# Patient Record
Sex: Male | Born: 1955 | Race: White | Hispanic: No | Marital: Married | Smoking: Current some day smoker
Health system: Southern US, Community
[De-identification: ages and names within clinical notes are randomized; demographics above are authoritative.]

## PROBLEM LIST (undated history)

## (undated) DIAGNOSIS — Z87438 Personal history of other diseases of male genital organs: Secondary | ICD-10-CM

## (undated) DIAGNOSIS — H9319 Tinnitus, unspecified ear: Secondary | ICD-10-CM

## (undated) DIAGNOSIS — K5730 Diverticulosis of large intestine without perforation or abscess without bleeding: Secondary | ICD-10-CM

## (undated) DIAGNOSIS — R911 Solitary pulmonary nodule: Secondary | ICD-10-CM

## (undated) DIAGNOSIS — J449 Chronic obstructive pulmonary disease, unspecified: Secondary | ICD-10-CM

## (undated) DIAGNOSIS — J189 Pneumonia, unspecified organism: Secondary | ICD-10-CM

## (undated) DIAGNOSIS — F419 Anxiety disorder, unspecified: Secondary | ICD-10-CM

## (undated) DIAGNOSIS — C679 Malignant neoplasm of bladder, unspecified: Secondary | ICD-10-CM

## (undated) DIAGNOSIS — Z8669 Personal history of other diseases of the nervous system and sense organs: Secondary | ICD-10-CM

## (undated) DIAGNOSIS — M1990 Unspecified osteoarthritis, unspecified site: Secondary | ICD-10-CM

## (undated) DIAGNOSIS — M5030 Other cervical disc degeneration, unspecified cervical region: Secondary | ICD-10-CM

## (undated) DIAGNOSIS — D494 Neoplasm of unspecified behavior of bladder: Secondary | ICD-10-CM

## (undated) DIAGNOSIS — R221 Localized swelling, mass and lump, neck: Secondary | ICD-10-CM

## (undated) DIAGNOSIS — I10 Essential (primary) hypertension: Secondary | ICD-10-CM

## (undated) DIAGNOSIS — Z8781 Personal history of (healed) traumatic fracture: Secondary | ICD-10-CM

## (undated) DIAGNOSIS — Z87828 Personal history of other (healed) physical injury and trauma: Secondary | ICD-10-CM

## (undated) HISTORY — PX: HAND SURGERY: SHX662

## (undated) HISTORY — PX: FRACTURE SURGERY: SHX138

## (undated) HISTORY — PX: COLONOSCOPY: SHX174

---

## 1979-06-29 DIAGNOSIS — Z8781 Personal history of (healed) traumatic fracture: Secondary | ICD-10-CM

## 1979-06-29 DIAGNOSIS — Z87828 Personal history of other (healed) physical injury and trauma: Secondary | ICD-10-CM

## 1979-06-29 HISTORY — DX: Personal history of other (healed) physical injury and trauma: Z87.828

## 1979-06-29 HISTORY — DX: Personal history of (healed) traumatic fracture: Z87.81

## 1999-11-13 ENCOUNTER — Emergency Department (HOSPITAL_COMMUNITY): Admission: EM | Admit: 1999-11-13 | Discharge: 1999-11-13 | Payer: Self-pay | Admitting: Emergency Medicine

## 2004-08-25 ENCOUNTER — Ambulatory Visit: Payer: Self-pay | Admitting: Internal Medicine

## 2005-06-02 ENCOUNTER — Ambulatory Visit: Payer: Self-pay | Admitting: Internal Medicine

## 2005-10-28 ENCOUNTER — Emergency Department (HOSPITAL_COMMUNITY): Admission: EM | Admit: 2005-10-28 | Discharge: 2005-10-28 | Payer: Self-pay | Admitting: Emergency Medicine

## 2011-08-20 ENCOUNTER — Emergency Department (HOSPITAL_COMMUNITY): Payer: Worker's Compensation

## 2011-08-20 ENCOUNTER — Emergency Department (HOSPITAL_COMMUNITY)
Admission: EM | Admit: 2011-08-20 | Discharge: 2011-08-20 | Disposition: A | Discharge status: Home / Self Care | Payer: Worker's Compensation | Attending: Emergency Medicine | Admitting: Emergency Medicine

## 2011-08-20 ENCOUNTER — Encounter (HOSPITAL_COMMUNITY): Payer: Self-pay | Admitting: *Deleted

## 2011-08-20 DIAGNOSIS — IMO0002 Reserved for concepts with insufficient information to code with codable children: Secondary | ICD-10-CM | POA: Insufficient documentation

## 2011-08-20 DIAGNOSIS — M79609 Pain in unspecified limb: Secondary | ICD-10-CM | POA: Insufficient documentation

## 2011-08-20 DIAGNOSIS — S9030XA Contusion of unspecified foot, initial encounter: Secondary | ICD-10-CM

## 2011-08-20 DIAGNOSIS — Y9269 Other specified industrial and construction area as the place of occurrence of the external cause: Secondary | ICD-10-CM | POA: Insufficient documentation

## 2011-08-20 MED ORDER — HYDROCODONE-ACETAMINOPHEN 5-325 MG PO TABS: 1.0000 | ORAL_TABLET | ORAL | Status: AC | PRN

## 2011-08-20 MED ORDER — OXYCODONE-ACETAMINOPHEN 5-325 MG PO TABS
1.0000 | ORAL_TABLET | Freq: Once | ORAL | Status: AC
  Administered 2011-08-20: 1 via ORAL
  Filled 2011-08-20: qty 1

## 2011-08-20 NOTE — ED Provider Notes (Signed)
History     CSN: [id number]  Arrival date & time 08/20/11  1810   First MD Initiated Contact with Patient 08/20/11 1826      Chief Complaint  Patient presents with  . Foot Injury    (Consider location/radiation/quality/duration/timing/severity/associated sxs/prior treatment) HPI  The patient presents to the emergency department with complaints of injuring his foot while working. Patient was moving a box and did not see a heavy metal bar behind a box when it rolled down and landed on his right foot hurting his great toe and the top part of his foot. The patient is able to walk on with pain. The patient admits that the toe is swollen, painful. The patient denies injury to any other part of his body. This is a workers comp issue as the patient is here with a representative from work.  History reviewed. No pertinent past medical history.  History reviewed. No pertinent past surgical history.  History reviewed. No pertinent family history.  History  Substance Use Topics  . Smoking status: Not on file  . Smokeless tobacco: Not on file  . Alcohol Use: Not on file      Review of Systems  All other systems reviewed and are negative.    Allergies  Review of patient's allergies indicates no known allergies.  Home Medications   Current Outpatient Rx  Name Route Sig Dispense Refill  . HYDROCODONE-ACETAMINOPHEN 5-325 MG PO TABS Oral Take 1 tablet by mouth every 4 (four) hours as needed for pain. 6 tablet 0    BP 138/83  Pulse 76  Temp(Src) 98 F (36.7 C) (Oral)  Resp 20  SpO2 100%  Physical Exam  Nursing note and vitals reviewed. Constitutional: He appears well-developed and well-nourished. No distress.  HENT:  Head: Normocephalic and atraumatic.  Eyes: Pupils are equal, round, and reactive to light.  Neck: Normal range of motion. Neck supple.  Cardiovascular: Normal rate and regular rhythm.   Pulmonary/Chest: Effort normal.  Abdominal: Soft.  Musculoskeletal:     Right ankle: He exhibits decreased range of motion (ot right great toe due to pain) and swelling. He exhibits no ecchymosis, no deformity, no laceration and normal pulse. tenderness (to anteerior/lateral portion of foot). Achilles tendon normal.       Feet:  Neurological: He is alert.  Skin: Skin is warm and dry.    ED Course  Procedures (including critical care time)  Labs Reviewed - No data to display Dg Foot Complete Right  08/20/2011  *RADIOLOGY REPORT*  Clinical Data: Right foot injury with pain.  RIGHT FOOT COMPLETE - 3+ VIEW  Comparison: None.  Findings: Linear lucency in the calcaneus is probably from osseous ridging given that the patient's pain is along the great toe and metatarsals.  No fracture is observed.  An os peroneus is present.  No malalignment at the Lisfranc joint.  IMPRESSION:  1.  No significant abnormality identified.  Original Report Authenticated By: Dellia Cloud, M.D.     1. Foot contusion       MDM  Pt xray negative for fracture. Pt given ice pack,  post op boot, Lortab (6 pills), and referral to Ortho.        Dorthula Matas, PA 08/20/11 1901

## 2011-08-20 NOTE — Discharge Instructions (Signed)
Bone Bruise  A bone bruise is a small hidden fracture of the bone. It typically occurs with bones located close to the surface of the skin.  SYMPTOMS  The pain lasts longer than a normal bruise.   The bruised area is difficult to use.   There may be discoloration or swelling of the bruised area.   When a bone bruise is found with injury to the anterior cruciate ligament (in the knee) there is often an increased:   Amount of fluid in the knee   Time the fluid in the knee lasts.   Number of days until you are walking normally and regaining the motion you had before the injury.   Number of days with pain from the injury.  DIAGNOSIS  It can only be seen on X-rays known as MRIs. This stands for magnetic resonance imaging. A regular X-ray taken of a bone bruise would appear to be normal. A bone bruise is a common injury in the knee and the heel bone (calcaneus). The problems are similar to those produced by stress fractures, which are bone injuries caused by overuse. A bone bruise may also be a sign of other injuries. For example, bone bruises are commonly found where an anterior cruciate ligament (ACL) in the knee has been pulled away from the bone (ruptured). A ligament is a tough fibrous material that connects bones together to make our joints stable. Bruises of the bone last a lot longer than bruises of the muscle or tissues beneath the skin. Bone bruises can last from days to months and are often more severe and painful than other bruises. TREATMENT Because bone bruises are sudden injuries you cannot often prevent them, other than by being extremely careful. Some things you can do to improve the condition are:  Apply ice to the sore area for 15 to 20 minutes, 3 to 4 times per day while awake for the first 2 days. Put the ice in a plastic bag, and place a towel between the bag of ice and your skin.   Keep your bruised area raised (elevated) when possible to lessen swelling.   For activity:     Use crutches when necessary; do not put weight on the injured leg until you are no longer tender.   You may walk on your affected part as the pain allows, or as instructed.   Start weight bearing gradually on the bruised part.   Continue to use crutches or a cane until you can stand without causing pain, or as instructed.   If a plaster splint was applied, wear the splint until you are seen for a follow-up examination. Rest it on nothing harder than a pillow the first 24 hours. Do not put weight on it. Do not get it wet. You may take it off to take a shower or bath.   If an air splint was applied, more air may be blown into or out of the splint as needed for comfort. You may take it off at night and to take a shower or bath.   Wiggle your toes in the splint several times per day if you are able.   You may have been given an elastic bandage to use with the plaster splint or alone. The splint is too tight if you have numbness, tingling or if your foot becomes cold and blue. Adjust the bandage to make it comfortable.   Only take over-the-counter or prescription medicines for pain, discomfort, or fever as directed by   your caregiver.   Follow all instructions for follow up with your caregiver. This includes any orthopedic referrals, physical therapy, and rehabilitation. Any delay in obtaining necessary care could result in a delay or failure of the bones to heal.  SEEK MEDICAL CARE IF:   You have an increase in bruising, swelling, or pain.   You notice coldness of your toes.   You do not get pain relief with medications.  SEEK IMMEDIATE MEDICAL CARE IF:   Your toes are numb or blue.   You have severe pain not controlled with medications.   If any of the problems that caused you to seek care are becoming worse.  Document Released: 09/04/2003 Document Revised: 02/24/2011 Document Reviewed: 01/17/2008 [facility] Patient Information 2012 [place], [place].Contusion (Bruise) of Foot Injury  to the foot causes bruises (contusions). Contusions are caused by bleeding from small blood vessels that allow blood to leak out into the muscles, cord-like structures that attach muscle to bone (tendons), and/or other soft tissue.  CAUSES  Contusions of the foot are common. Bruises are frequently seen from:  Contact sports injuries.   The use of medications that thin the blood (anti-coagulants).   Aspirin and non-steroidal anti-inflammatory agents that decrease the clotting ability.   People with vitamin deficiencies.  SYMPTOMS  Signs of foot injury include pain and swelling. At first there may be discoloration from blood under the skin. This will appear blue to purple in color. As the bruise ages, the color turns yellow. Swelling may limit the movement of the toes.  Complications from foot injury may include:  Collections of blood leading to disability if calcium deposits form. These can later limit movement in the foot.   Infection of the foot if there are breaks in the skin.   Rupture of the tendons that may need surgical repair.  DIAGNOSIS  Diagnosing foot injuries can be made by observation. If problems continue, X-rays may be needed to make sure there are no broken bones (fractures). Continuing problems may require physical therapy.  HOME CARE INSTRUCTIONS   Apply ice to the injury for 15 to 20 minutes, 3 to 4 times per day. Put the ice in a plastic bag and place a towel between the bag of ice and your skin.   An elastic wrap (like an Ace bandage) may be used to keep swelling down.   Keep foot elevated to reduce swelling and discomfort.   Try to avoid standing or walking while the foot is painful. Do not resume use until instructed by your caregiver. Then begin use gradually. If pain develops, decrease use and continue the above measures. Gradually increase activities that do not cause discomfort until you slowly have normal use.   Only take over-the-counter or prescription  medicines for pain, discomfort, or fever as directed by your caregiver. Use only if your caregiver has not given medications that would interfere.   Begin daily rehabilitation exercises when supportive wrapping is no longer needed.   Use ice massage for 10 minutes before and after workouts. Fill a large styrofoam cup with water and freeze. Tear a small amount of foam from the top so ice protrudes. Massage ice firmly over the injured area in a circle about the size of a softball.   Always eat a well balanced diet.   Follow all instructions for follow up with your caregiver, any orthopedic referrals, physical therapy and rehabilitation. Any delay in obtaining necessary care could result in delayed healing, and temporary or permanent disability.  SEEK IMMEDIATE MEDICAL CARE IF:   Your pain and swelling increase, or pain is uncontrolled with medications.   You have loss of feeling in your foot, or your foot turns cold or blue.   An oral temperature above 102 F (38.9 C) develops, not controlled by medication.   Your foot becomes warm to touch, or you have more pain with movement of your toes.   You have a foot contusion that does not improve in 1 or 2 days.   Skin is broken and signs of infection occur (drainage, increasing pain, fever, headache, muscle aches, dizziness or a general ill feeling).   You develop new, unexplained symptoms, or an increase of the symptoms that brought you to your caregiver.  MAKE SURE YOU:   Understand these instructions.   Will watch your condition.   Will get help right away if you are not doing well or get worse.  Document Released: 04/05/2006 Document Revised: 02/24/2011 Document Reviewed: 05/30/2007 [facility] Patient Information 2012 [place], [place].

## 2011-08-20 NOTE — ED Notes (Signed)
Pt in c/o right foot injury at work, states a bar fell on his foot, swelling noted

## 2011-08-20 NOTE — ED Notes (Signed)
Patient transported to X-ray 

## 2011-08-21 NOTE — ED Provider Notes (Signed)
Medical screening examination/treatment/procedure(s) were performed by non-physician practitioner and as supervising physician I was immediately available for consultation/collaboration.  Ghazal Pevey T Marthann Abshier, MD 08/21/11 2027 

## 2014-09-30 ENCOUNTER — Encounter: Payer: Self-pay | Admitting: Family Medicine

## 2014-09-30 ENCOUNTER — Ambulatory Visit (INDEPENDENT_AMBULATORY_CARE_PROVIDER_SITE_OTHER): Payer: BLUE CROSS/BLUE SHIELD | Admitting: Family Medicine

## 2014-09-30 VITALS — BP 130/82 | HR 78 | Temp 98.6°F | Resp 16 | Ht 67.75 in | Wt 199.2 lb

## 2014-09-30 DIAGNOSIS — N410 Acute prostatitis: Secondary | ICD-10-CM

## 2014-09-30 DIAGNOSIS — R310 Gross hematuria: Secondary | ICD-10-CM

## 2014-09-30 LAB — POCT UA - MICROSCOPIC ONLY
CASTS, UR, LPF, POC: NEGATIVE
Crystals, Ur, HPF, POC: NEGATIVE
Epithelial cells, urine per micros: NEGATIVE
MUCUS UA: NEGATIVE
WBC, UR, HPF, POC: NEGATIVE

## 2014-09-30 LAB — COMPREHENSIVE METABOLIC PANEL
ALBUMIN: 4.4 g/dL (ref 3.5–5.2)
ALK PHOS: 64 U/L (ref 39–117)
ALT: 25 U/L (ref 0–53)
AST: 21 U/L (ref 0–37)
BUN: 16 mg/dL (ref 6–23)
CHLORIDE: 105 meq/L (ref 96–112)
CO2: 24 meq/L (ref 19–32)
Calcium: 9.5 mg/dL (ref 8.4–10.5)
Creat: 0.85 mg/dL (ref 0.50–1.35)
GLUCOSE: 84 mg/dL (ref 70–99)
Potassium: 4.3 mEq/L (ref 3.5–5.3)
Sodium: 138 mEq/L (ref 135–145)
Total Bilirubin: 0.4 mg/dL (ref 0.2–1.2)
Total Protein: 7.6 g/dL (ref 6.0–8.3)

## 2014-09-30 LAB — CBC
HCT: 47.1 % (ref 39.0–52.0)
Hemoglobin: 16.2 g/dL (ref 13.0–17.0)
MCH: 30.4 pg (ref 26.0–34.0)
MCHC: 34.4 g/dL (ref 30.0–36.0)
MCV: 88.4 fL (ref 78.0–100.0)
MPV: 10.1 fL (ref 8.6–12.4)
PLATELETS: 228 10*3/uL (ref 150–400)
RBC: 5.33 MIL/uL (ref 4.22–5.81)
RDW: 13.7 % (ref 11.5–15.5)
WBC: 10.4 10*3/uL (ref 4.0–10.5)

## 2014-09-30 LAB — POCT URINALYSIS DIPSTICK
GLUCOSE UA: NEGATIVE
NITRITE UA: NEGATIVE
PH UA: 5
Protein, UA: >=300
Spec Grav, UA: 1.025
UROBILINOGEN UA: 1

## 2014-09-30 MED ORDER — CIPROFLOXACIN HCL 500 MG PO TABS: 500.0000 mg | ORAL_TABLET | Freq: Two times a day (BID) | ORAL | Status: DC

## 2014-09-30 NOTE — Progress Notes (Signed)
   Subjective:    Patient ID: Ronald Atkins, male    DOB: [birth year], 59 y.o.   MRN: [id number]  HPI This is a pleasant 59 yo male who presents today with hematuria. He has not seen his PCP for several years. 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  He had an episode of rust colored urine last night and then had several episodes of frank hematuria. He has felt run down over the last week.   He had hematuria about 6 years ago and was diagnosed with prostatitis. He was treated with antibiotics. He takes vitamins and his urine is usually bright yellow.  He stopped smoking cigarettes 10 months ago (smoked 2 ppd x 15 years, 1 ppd x 1) and uses vaporized nicotine BID.   History reviewed. No pertinent past medical history. Past Surgical History  Procedure Laterality Date  . Fracture surgery     Family History  Problem Relation Age of Onset  . Heart disease Mother    History  Substance Use Topics  . Smoking status: Former Research scientist (life sciences)  . Smokeless tobacco: Not on file  . Alcohol Use: 0.6 oz/week    1 Standard drinks or equivalent per week   Review of Systems No fever, mild nausea, no dysuria, + frequency, no flank pain, some lower abdominal pressure.    Objective:   Physical Exam  Constitutional: He is oriented to person, place, and time. He appears well-developed and well-nourished.  HENT:  Head: Normocephalic and atraumatic.  Eyes: Conjunctivae are normal.  Neck: Normal range of motion. Neck supple.  Cardiovascular: Normal rate and regular rhythm.   Pulmonary/Chest: Effort normal and breath sounds normal.  Abdominal: Soft. Bowel sounds are normal. He exhibits no distension and no mass. There is tenderness in the suprapubic area. There is no rebound, no guarding and no CVA tenderness.  Genitourinary: Prostate is enlarged (mildly) and tender (mildly).  Musculoskeletal: Normal range of motion.  Neurological: He is alert and oriented to person, place, and time.  Skin: Skin is warm  and dry.  Psychiatric: He has a normal mood and affect. His behavior is normal. Judgment and thought content normal.  Vitals reviewed.  BP 130/82 mmHg  Pulse 78  Temp(Src) 98.6 F (37 C) (Oral)  Resp 16  Ht 5' 7.75" (1.721 m)  Wt 199 lb 3.2 oz (90.357 kg)  BMI 30.51 kg/m2  SpO2 98%     Assessment & Plan:  Discussed with Dr. Tamala Julian 1. Gross hematuria - POCT urinalysis dipstick - POCT UA - Microscopic Only - CBC - Comprehensive metabolic panel - Ambulatory referral to Urology - Urine culture  2. Acute prostatitis - ciprofloxacin (CIPRO) 500 MG tablet; Take 1 tablet (500 mg total) by mouth 2 (two) times daily.  Dispense: 14 tablet; Refill: 0  - RTC if fever greater than 101, vomiting, excessive bleeding   Elby Beck, FNP-BC  [facility]  10/04/2014 1:53 PM

## 2014-09-30 NOTE — Patient Instructions (Addendum)
Drink at least 8 glasses of liquids daily Take antibiotic twice a day for 14 days We will call you with an appointment for urology

## 2014-10-01 LAB — URINE CULTURE
Colony Count: NO GROWTH
Organism ID, Bacteria: NO GROWTH

## 2014-11-27 DIAGNOSIS — R911 Solitary pulmonary nodule: Secondary | ICD-10-CM

## 2014-11-27 DIAGNOSIS — K5730 Diverticulosis of large intestine without perforation or abscess without bleeding: Secondary | ICD-10-CM

## 2014-11-27 HISTORY — DX: Solitary pulmonary nodule: R91.1

## 2014-11-27 HISTORY — DX: Diverticulosis of large intestine without perforation or abscess without bleeding: K57.30

## 2015-01-13 ENCOUNTER — Other Ambulatory Visit: Payer: Self-pay | Admitting: Urology

## 2015-02-04 ENCOUNTER — Encounter (HOSPITAL_BASED_OUTPATIENT_CLINIC_OR_DEPARTMENT_OTHER): Payer: Self-pay | Admitting: *Deleted

## 2015-02-04 NOTE — Progress Notes (Signed)
SPOKE W/ WIFE.  NPO AFTER MN.  ARRIVE AT 0600.  NEEDS HG.

## 2015-02-10 NOTE — H&P (Signed)
History of Present Illness     Follow-up- PCP NP Remus Loffler hematuria-May 2016-last month patient developed rust-colored urine which then turned to frank hematuria. He passed a "ball of tissue" which might have been a small clot. He had some flank pain and also some malaise. All of his symptoms improved. His UA showed too numerous to count red cells, BUN 16, creatinine 0.85. He was started on Cipro for possible prostatitis. He has no history of kidney stones or urologic surgery. He has no lower urinary tract symptoms.    He is a former smoker and quit about 10 months ago. He has a 15-pack-year history. He is a Musician.     Urologic history includes treatment for hematuria and prostatitis about 6 years ago.     July 2016 interval history  Patient returns and continue management of gross hematuria. He underwent CT hematuria protocol and no vomiting which revealed a right posterior bladder tumor. Tumor measured about 2 cm. There was no hydronephrosis.    He's been well without dysuria or gross hematuria.   Surgical History Problems  1. History of No Surgical Problems  Current Meds 1. Ibuprofen TABS;  Therapy: (Recorded:23May2016) to Recorded 2. Multi-Vitamin TABS;  Therapy: (Recorded:23May2016) to Recorded  Allergies Medication  1. No Known Drug Allergies  Family History Problems  1. Family history of heart murmur (Z84.89) : Mother 2. Family history of hepatic cirrhosis (Z83.79) : Father 3. Family history of malignant neoplasm (Z80.9) : Mother  Social History Problems  1. Daily caffeine consumption, more than 8 servings a day 2. Father deceased 3. Former smoker [phone]) 4. Married 5. Mother deceased 83. Occasional alcohol use 7. Recently stopped smoking 8. Two children 9. Use of non-nicotine containing substance in combustion-free vaporization device (Z78.9)  Vitals Vital Signs [Data Includes: Last 1 Day]  Recorded: [id number] 03:35PM  Blood  Pressure: 137 / 88 Temperature: 97 F Heart Rate: 65  Physical Exam Constitutional: Well nourished and well developed . No acute distress.  Pulmonary: No respiratory distress and normal respiratory rhythm and effort.  Cardiovascular: Heart rate and rhythm are normal . No peripheral edema.  Neuro/Psych:. Mood and affect are appropriate.    Results/Data Urine [Data Includes: Last 1 Day]   [id number]  COLOR YELLOW   APPEARANCE CLEAR   SPECIFIC GRAVITY 1.020   pH 6.0   GLUCOSE NEG mg/dL  BILIRUBIN NEG   KETONE NEG mg/dL  BLOOD LARGE   PROTEIN NEG mg/dL  UROBILINOGEN 0.2 mg/dL  NITRITE NEG   LEUKOCYTE ESTERASE NEG   SQUAMOUS EPITHELIAL/HPF NONE SEEN   WBC NONE SEEN WBC/hpf  RBC 3-6 RBC/hpf  BACTERIA NONE SEEN   CRYSTALS NONE SEEN   CASTS NONE SEEN   Other MUCUS NOTED    The following images/tracing/specimen were independently visualized:  CT - [facility].    Procedure  Procedure: Cystoscopy   Indication: Hematuria.  Informed Consent: Risks, benefits, and potential adverse events were discussed and informed consent was obtained from the patient.  Prep: The patient was prepped with betadine.  Antibiotic prophylaxis: Ciprofloxacin.  Procedure Note:  Urethral meatus:. No abnormalities.  Anterior urethra: No abnormalities.  Prostatic urethra: No abnormalities.  Bladder: Visulization was clear. The ureteral orifices were in the normal anatomic position bilaterally and had clear efflux of urine. A systematic survey of the bladder demonstrated no bladder tumors or stones. The mucosa was smooth without abnormalities. A solitary tumor was visualized in the bladder. A papillary tumor was seen in  the bladder. This tumor was located on the right side, at the neck of the bladder. The patient tolerated the procedure well.  Complications: None.    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 Follow-up Schedule Surgery Office  Follow-up  Status: Hold For -  Appointment   Requested for: [phone] Gross hematuria  2. AU CT-HEMATURIA PROTOCOL; Status:Canceled - Date of Service;  Health Maintenance  3. UA With REFLEX; [Do Not Release]; Status:Resulted - Requires Verification;   Done:  [id number] 03:06PM  Discussion/Summary Right bladder neoplasm-it appears to be emanating from the right bladder neck could be more lateral. I believe I could see the UO clear underneath it. We discussed the nature risks benefits and alternatives to cystoscopy, TURBT with postop mitomycin C. We discussed he might need a ureteral stent. All questions answered.    We discussed the nature bladder neoplasms to recur in progress as well as potential need for restaging procedure.     Signatures Electronically signed by : [name], M.D.; Jan 10 2015  4:37PM EST

## 2015-02-11 ENCOUNTER — Ambulatory Visit (HOSPITAL_BASED_OUTPATIENT_CLINIC_OR_DEPARTMENT_OTHER): Payer: BLUE CROSS/BLUE SHIELD | Admitting: Anesthesiology

## 2015-02-11 ENCOUNTER — Encounter (HOSPITAL_BASED_OUTPATIENT_CLINIC_OR_DEPARTMENT_OTHER): Payer: Self-pay | Admitting: Anesthesiology

## 2015-02-11 ENCOUNTER — Ambulatory Visit (HOSPITAL_BASED_OUTPATIENT_CLINIC_OR_DEPARTMENT_OTHER)
Admission: RE | Admit: 2015-02-11 | Discharge: 2015-02-11 | Disposition: A | Discharge status: Home / Self Care | Payer: BLUE CROSS/BLUE SHIELD | Source: Ambulatory Visit | Attending: Urology | Admitting: Urology

## 2015-02-11 ENCOUNTER — Encounter (HOSPITAL_BASED_OUTPATIENT_CLINIC_OR_DEPARTMENT_OTHER)
Admission: RE | Disposition: A | Discharge status: Home / Self Care | Payer: Self-pay | Source: Ambulatory Visit | Attending: Urology

## 2015-02-11 DIAGNOSIS — N359 Urethral stricture, unspecified: Secondary | ICD-10-CM | POA: Insufficient documentation

## 2015-02-11 DIAGNOSIS — Z87891 Personal history of nicotine dependence: Secondary | ICD-10-CM | POA: Insufficient documentation

## 2015-02-11 DIAGNOSIS — Z789 Other specified health status: Secondary | ICD-10-CM | POA: Diagnosis not present

## 2015-02-11 DIAGNOSIS — D494 Neoplasm of unspecified behavior of bladder: Secondary | ICD-10-CM

## 2015-02-11 DIAGNOSIS — C679 Malignant neoplasm of bladder, unspecified: Secondary | ICD-10-CM | POA: Diagnosis not present

## 2015-02-11 DIAGNOSIS — R310 Gross hematuria: Secondary | ICD-10-CM | POA: Diagnosis not present

## 2015-02-11 HISTORY — DX: Neoplasm of unspecified behavior of bladder: D49.4

## 2015-02-11 HISTORY — PX: TRANSURETHRAL RESECTION OF BLADDER TUMOR: SHX2575

## 2015-02-11 HISTORY — DX: Personal history of other diseases of male genital organs: Z87.438

## 2015-02-11 LAB — POCT HEMOGLOBIN-HEMACUE: Hemoglobin: 15.2 g/dL (ref 13.0–17.0)

## 2015-02-11 SURGERY — TURBT (TRANSURETHRAL RESECTION OF BLADDER TUMOR)
Anesthesia: General | Site: Bladder

## 2015-02-11 MED ORDER — CEFAZOLIN SODIUM 1-5 GM-% IV SOLN
1.0000 g | INTRAVENOUS | Status: DC
  Filled 2015-02-11: qty 50

## 2015-02-11 MED ORDER — LIDOCAINE HCL (CARDIAC) 20 MG/ML IV SOLN
INTRAVENOUS | Status: DC | PRN
  Administered 2015-02-11: 100 mg via INTRAVENOUS

## 2015-02-11 MED ORDER — ACETAMINOPHEN 10 MG/ML IV SOLN
INTRAVENOUS | Status: DC | PRN
  Administered 2015-02-11: 1000 mg via INTRAVENOUS

## 2015-02-11 MED ORDER — NITROFURANTOIN MONOHYD MACRO 100 MG PO CAPS: 100.0000 mg | ORAL_CAPSULE | Freq: Every day | ORAL | Status: DC

## 2015-02-11 MED ORDER — LIDOCAINE HCL 2 % EX GEL
CUTANEOUS | Status: DC | PRN
  Administered 2015-02-11: 1

## 2015-02-11 MED ORDER — MIDAZOLAM HCL 2 MG/2ML IJ SOLN
INTRAMUSCULAR | Status: AC
  Filled 2015-02-11: qty 2

## 2015-02-11 MED ORDER — LACTATED RINGERS IV SOLN
INTRAVENOUS | Status: DC
  Administered 2015-02-11: 07:00:00 via INTRAVENOUS
  Filled 2015-02-11: qty 1000

## 2015-02-11 MED ORDER — OXYCODONE-ACETAMINOPHEN 5-325 MG PO TABS: 1.0000 | ORAL_TABLET | Freq: Four times a day (QID) | ORAL | Status: DC | PRN

## 2015-02-11 MED ORDER — PROPOFOL 10 MG/ML IV BOLUS
INTRAVENOUS | Status: DC | PRN
  Administered 2015-02-11: 250 mg via INTRAVENOUS

## 2015-02-11 MED ORDER — MITOMYCIN CHEMO FOR BLADDER INSTILLATION 40 MG
40.0000 mg | Freq: Once | INTRAVENOUS | Status: DC
  Filled 2015-02-11: qty 40

## 2015-02-11 MED ORDER — FENTANYL CITRATE (PF) 100 MCG/2ML IJ SOLN
INTRAMUSCULAR | Status: DC | PRN
  Administered 2015-02-11 (×2): 50 ug via INTRAVENOUS

## 2015-02-11 MED ORDER — EPHEDRINE SULFATE 50 MG/ML IJ SOLN
INTRAMUSCULAR | Status: DC | PRN
  Administered 2015-02-11: 15 mg via INTRAVENOUS
  Administered 2015-02-11: 10 mg via INTRAVENOUS

## 2015-02-11 MED ORDER — SUCCINYLCHOLINE CHLORIDE 20 MG/ML IJ SOLN
INTRAMUSCULAR | Status: DC | PRN
  Administered 2015-02-11: 50 mg via INTRAVENOUS

## 2015-02-11 MED ORDER — BELLADONNA ALKALOIDS-OPIUM 16.2-60 MG RE SUPP
RECTAL | Status: AC
  Filled 2015-02-11: qty 1

## 2015-02-11 MED ORDER — SODIUM CHLORIDE 0.9 % IR SOLN
Status: DC | PRN
  Administered 2015-02-11: 19000 mL

## 2015-02-11 MED ORDER — DEXAMETHASONE SODIUM PHOSPHATE 4 MG/ML IJ SOLN
INTRAMUSCULAR | Status: DC | PRN
  Administered 2015-02-11: 10 mg via INTRAVENOUS

## 2015-02-11 MED ORDER — CEFAZOLIN SODIUM-DEXTROSE 2-3 GM-% IV SOLR
INTRAVENOUS | Status: AC
  Filled 2015-02-11: qty 50

## 2015-02-11 MED ORDER — CEFAZOLIN SODIUM-DEXTROSE 2-3 GM-% IV SOLR
2.0000 g | INTRAVENOUS | Status: AC
  Administered 2015-02-11: 2 g via INTRAVENOUS
  Filled 2015-02-11: qty 50

## 2015-02-11 MED ORDER — PROMETHAZINE HCL 25 MG/ML IJ SOLN
6.2500 mg | INTRAMUSCULAR | Status: DC | PRN
  Filled 2015-02-11: qty 1

## 2015-02-11 MED ORDER — FENTANYL CITRATE (PF) 100 MCG/2ML IJ SOLN
INTRAMUSCULAR | Status: AC
  Filled 2015-02-11: qty 4

## 2015-02-11 MED ORDER — BELLADONNA ALKALOIDS-OPIUM 16.2-60 MG RE SUPP
RECTAL | Status: DC | PRN
  Administered 2015-02-11: 1 via RECTAL

## 2015-02-11 MED ORDER — ONDANSETRON HCL 4 MG/2ML IJ SOLN
INTRAMUSCULAR | Status: DC | PRN
  Administered 2015-02-11: 4 mg via INTRAVENOUS

## 2015-02-11 MED ORDER — MIDAZOLAM HCL 5 MG/5ML IJ SOLN
INTRAMUSCULAR | Status: DC | PRN
  Administered 2015-02-11: 2 mg via INTRAVENOUS

## 2015-02-11 MED ORDER — FENTANYL CITRATE (PF) 100 MCG/2ML IJ SOLN
25.0000 ug | INTRAMUSCULAR | Status: DC | PRN
  Filled 2015-02-11: qty 1

## 2015-02-11 SURGICAL SUPPLY — 54 items
ADAPTER CATH URET PLST 4-6FR (CATHETERS) IMPLANT
BAG DRAIN URO-CYSTO SKYTR STRL (DRAIN) ×4 IMPLANT
BAG URINE DRAINAGE (UROLOGICAL SUPPLIES) ×4 IMPLANT
BAG URINE LEG 19OZ MD ST LTX (BAG) IMPLANT
BASKET LASER NITINOL 1.9FR (BASKET) IMPLANT
BASKET STNLS GEMINI 4WIRE 3FR (BASKET) IMPLANT
BASKET ZERO TIP NITINOL 2.4FR (BASKET) IMPLANT
CANISTER SUCT LVC 12 LTR MEDI- (MISCELLANEOUS) IMPLANT
CATH FOLEY 2WAY SLVR  5CC 18FR (CATHETERS) ×2
CATH FOLEY 2WAY SLVR  5CC 20FR (CATHETERS)
CATH FOLEY 2WAY SLVR  5CC 22FR (CATHETERS)
CATH FOLEY 2WAY SLVR 5CC 18FR (CATHETERS) ×2 IMPLANT
CATH FOLEY 2WAY SLVR 5CC 20FR (CATHETERS) IMPLANT
CATH FOLEY 2WAY SLVR 5CC 22FR (CATHETERS) IMPLANT
CATH INTERMIT  6FR 70CM (CATHETERS) IMPLANT
CATH URET 5FR 28IN CONE TIP (BALLOONS)
CATH URET 5FR 28IN OPEN ENDED (CATHETERS) IMPLANT
CATH URET 5FR 70CM CONE TIP (BALLOONS) IMPLANT
CATH URET DUAL LUMEN 6-10FR 50 (CATHETERS) IMPLANT
CLOTH BEACON ORANGE TIMEOUT ST (SAFETY) ×4 IMPLANT
DRSG TELFA 3X8 NADH (GAUZE/BANDAGES/DRESSINGS) IMPLANT
ELECT BUTTON BIOP 24F 90D PLAS (MISCELLANEOUS) IMPLANT
ELECT REM PT RETURN 9FT ADLT (ELECTROSURGICAL) ×4
ELECTRODE REM PT RTRN 9FT ADLT (ELECTROSURGICAL) ×2 IMPLANT
EVACUATOR MICROVAS BLADDER (UROLOGICAL SUPPLIES) IMPLANT
GLOVE BIO SURGEON STRL SZ 6.5 (GLOVE) ×3 IMPLANT
GLOVE BIO SURGEON STRL SZ7 (GLOVE) ×8 IMPLANT
GLOVE BIO SURGEON STRL SZ7.5 (GLOVE) ×4 IMPLANT
GLOVE BIO SURGEONS STRL SZ 6.5 (GLOVE) ×1
GLOVE BIOGEL PI IND STRL 6.5 (GLOVE) ×4 IMPLANT
GLOVE BIOGEL PI INDICATOR 6.5 (GLOVE) ×4
GOWN STRL REUS W/ TWL LRG LVL3 (GOWN DISPOSABLE) ×2 IMPLANT
GOWN STRL REUS W/ TWL XL LVL3 (GOWN DISPOSABLE) ×2 IMPLANT
GOWN STRL REUS W/TWL LRG LVL3 (GOWN DISPOSABLE) ×2
GOWN STRL REUS W/TWL XL LVL3 (GOWN DISPOSABLE) ×6 IMPLANT
GUIDEWIRE 0.038 PTFE COATED (WIRE) IMPLANT
GUIDEWIRE ANG ZIPWIRE 038X150 (WIRE) IMPLANT
GUIDEWIRE STR DUAL SENSOR (WIRE) ×4 IMPLANT
HOLDER FOLEY CATH W/STRAP (MISCELLANEOUS) ×4 IMPLANT
IV NS 1000ML (IV SOLUTION) ×2
IV NS 1000ML BAXH (IV SOLUTION) ×2 IMPLANT
IV NS IRRIG 3000ML ARTHROMATIC (IV SOLUTION) ×24 IMPLANT
KIT BALLIN UROMAX 15FX10 (LABEL) IMPLANT
KIT BALLN UROMAX 15FX4 (MISCELLANEOUS) IMPLANT
KIT BALLN UROMAX 26 75X4 (MISCELLANEOUS)
LOOP CUT BIPOLAR 24F LRG (ELECTROSURGICAL) ×4 IMPLANT
MANIFOLD NEPTUNE II (INSTRUMENTS) ×4 IMPLANT
PACK CYSTO (CUSTOM PROCEDURE TRAY) ×4 IMPLANT
PLUG CATH AND CAP STER (CATHETERS) IMPLANT
SET ASPIRATION TUBING (TUBING) IMPLANT
SET HIGH PRES BAL DIL (LABEL)
SHEATH ACCESS URETERAL 38CM (SHEATH) IMPLANT
SYRINGE IRR TOOMEY STRL 70CC (SYRINGE) IMPLANT
WATER STERILE IRR 500ML POUR (IV SOLUTION) ×4 IMPLANT

## 2015-02-11 NOTE — Op Note (Signed)
Preoperative diagnosis: Bladder neoplasm Postoperative diagnosis: Bladder neoplasm, stricture of fossa navicularis, stricture of bulb urethra  Procedure: Exam under anesthesia, TURBT 2-5 cm  Surgeon: Junious Silk  Anesthesia: Denneny  Type of anesthesia: Gen.  Indication for procedure: 59 year old white male with gross hematuria bladder tumor on CT and cystoscopy. He was brought for TURBT and possible mitomycin-C today.  Findings: On exam under anesthesia the penis was circumcised without mass or lesion. The testicles were descended bilaterally and palpably normal. On digital rectal exam the prostate was smooth without hard area or nodule and all landmarks were preserved. I placed a B&O suppository.  On cystoscopy the fossa navicularis was strictured and required dilation to 1 [place], there was a wide caliber stricture in the bulbar urethra, the prostatic urethra appeared normal. In the bladder the trigone and ureteral orifices were in their normal orthotopic position with clear efflux. There was a right bladder tumor a few centimeters above the right ureteral orifice and situated laterally with a small extension anteriorly. The tumor was papillary but did have a more broad-based. It seemed to be superficial. There is an impressive number a feeder vessels going down to the tumor. There were no other tumors in the bladder. There are no stones or foreign bodies.  Description of procedure: After consent was obtained patient brought to the operating room. After adequate anesthesia the patient was placed in lithotomy position and prepped and draped in the usual sterile fashion. A timeout was performed to confirm the patient and procedure. An exam under anesthesia was performed. Tried to pass the 74 French cystoscope per urethra but the fossa navicularis was too tight and was dilated to 24 [place]. The cystoscope was inserted in the bladder carefully inspected with the 30 and 70 lens. The scope was removed  and the resectoscope was attempted to be placed with the visual obturator but would not pass. I had to dilate up to 28 [place] to get the scope to pass. The bulb urethral stricture was just dilated with the scope. Once in the bladder the tumor was resected. The chips were sent as bladder tumor. He had a very mild obturator kick therefore paralysis was given and I resected the base. This was sent as bladder tumor base. Hemostasis was excellent. There was one area that seemed thin but there was obvious continuity of the bladder wall and no perforation. I was concerned if the bladder filled any there was the small potential for extravasation of the mitomycin therefore I decided not to give it. The bladder was filled and the scope removed. A then placed an 67 French Foley left to gravity drainage. The urine was clear. A plan to leave the Foley in for a few days to allow this area to heal.  Complications: None Blood loss: Minimal  Specimens to pathology: #1 bladder tumor #2 bladder tumor base  Drains: 18 French Foley catheter  Disposition: Patient stable to PACU

## 2015-02-11 NOTE — Transfer of Care (Signed)
Immediate Anesthesia Transfer of Care Note  Patient: Ronald Atkins  Procedure(s) Performed: Procedure(s): TRANSURETHRAL RESECTION OF BLADDER TUMOR (TURBT)  (N/A)  Patient Location: PACU  Anesthesia Type:General  Level of Consciousness: awake, alert , oriented and patient cooperative  Airway & Oxygen Therapy: Patient Spontanous Breathing and Patient connected to nasal cannula oxygen  Post-op Assessment: Report given to RN and Post -op Vital signs reviewed and stable  Post vital signs: Reviewed and stable  Last Vitals:  Filed Vitals:   02/11/15 0616  BP: 144/94  Pulse: 63  Temp: 36.8 C  Resp: 16    Complications: No apparent anesthesia complications

## 2015-02-11 NOTE — Discharge Instructions (Signed)
Foley Catheter Care A Foley catheter is a soft, flexible tube that is placed into the bladder to drain urine. A Foley catheter may be inserted if:  You leak urine or are not able to control when you urinate (urinary incontinence).  You are not able to urinate when you need to (urinary retention).  You had prostate surgery or surgery on the genitals.  You have certain medical conditions, such as multiple sclerosis, dementia, or a spinal cord injury. If you are going home with a Foley catheter in place, follow the instructions below. TAKING CARE OF THE CATHETER  Wash your hands with soap and water.  Using mild soap and warm water on a clean washcloth:  Clean the area on your body closest to the catheter insertion site using a circular motion, moving away from the catheter. Never wipe toward the catheter because this could sweep bacteria up into the urethra and cause infection.  Remove all traces of soap. Pat the area dry with a clean towel. For males, reposition the foreskin.  Attach the catheter to your leg so there is no tension on the catheter. Use adhesive tape or a leg strap. If you are using adhesive tape, remove any sticky residue left behind by the previous tape you used.  Keep the drainage bag below the level of the bladder, but keep it off the floor.  Check throughout the day to be sure the catheter is working and urine is draining freely. Make sure the tubing does not become kinked.  Do not pull on the catheter or try to remove it. Pulling could damage internal tissues. TAKING CARE OF THE DRAINAGE BAGS You will be given two drainage bags to take home. One is a large overnight drainage bag, and the other is a smaller leg bag that fits underneath clothing. You may wear the overnight bag at any time, but you should never wear the smaller leg bag at night. Follow the instructions below for how to empty, change, and clean your drainage bags. Emptying the Drainage Bag You must empty  your drainage bag when it is  - full or at least 2-3 times a day.  Wash your hands with soap and water.  Keep the drainage bag below your hips, below the level of your bladder. This stops urine from going back into the tubing and into your bladder.  Hold the dirty bag over the toilet or a clean container.  Open the pour spout at the bottom of the bag and empty the urine into the toilet or container. Do not let the pour spout touch the toilet, container, or any other surface. Doing so can place bacteria on the bag, which can cause an infection.  Clean the pour spout with a gauze pad or cotton ball that has rubbing alcohol on it.  Close the pour spout.  Attach the bag to your leg with adhesive tape or a leg strap.  Wash your hands well. Changing the Drainage Bag Change your drainage bag once a month or sooner if it starts to smell bad or look dirty. Below are steps to follow when changing the drainage bag. 1. Wash your hands with soap and water. 2. Pinch off the rubber catheter so that urine does not spill out. 3. Disconnect the catheter tube from the drainage tube at the connection valve. Do not let the tubes touch any surface. 4. Clean the end of the catheter tube with an alcohol wipe. Use a different alcohol wipe to clean the  end of the drainage tube. 5. Connect the catheter tube to the drainage tube of the clean drainage bag. 6. Attach the new bag to the leg with adhesive tape or a leg strap. Avoid attaching the new bag too tightly. 7. Wash your hands well. Cleaning the Drainage Bag 1. Wash your hands with soap and water. 2. Wash the bag in warm, soapy water. 3. Rinse the bag thoroughly with warm water. 4. Fill the bag with a solution of white vinegar and water (1 cup vinegar to 1 qt warm water [.2 L vinegar to 1 L warm water]). Close the bag and soak it for 30 minutes in the solution. 5. Rinse the bag with warm water. 6. Hang the bag to dry with the pour spout open and hanging  downward. 7. Store the clean bag (once it is dry) in a clean plastic bag. 8. Wash your hands well. PREVENTING INFECTION  Wash your hands before and after handling your catheter.  Take showers daily and wash the area where the catheter enters your body. Do not take baths. Replace wet leg straps with dry ones, if this applies.  Do not use powders, sprays, or lotions on the genital area. Only use creams, lotions, or ointments as directed by your caregiver.  For females, wipe from front to back after each bowel movement.  Drink enough fluids to keep your urine clear or pale yellow unless you have a fluid restriction.  Do not let the drainage bag or tubing touch or lie on the floor.  Wear cotton underwear to absorb moisture and to keep your skin drier. SEEK MEDICAL CARE IF:   Your urine is cloudy or smells unusually bad.  Your catheter becomes clogged.  You are not draining urine into the bag or your bladder feels full.  Your catheter starts to leak. SEEK IMMEDIATE MEDICAL CARE IF:   You have pain, swelling, redness, or pus where the catheter enters the body.  You have pain in the abdomen, legs, lower back, or bladder.  You have a fever.  You see blood fill the catheter, or your urine is pink or red.  You have nausea, vomiting, or chills.  Your catheter gets pulled out. MAKE SURE YOU:   Understand these instructions.  Will watch your condition.  Will get help right away if you are not doing well or get worse.   Cystoscopy, Care After Refer to this sheet in the next few weeks. These instructions provide you with information on caring for yourself after your procedure. Your caregiver may also give you more specific instructions. Your treatment has been planned according to current medical practices, but problems sometimes occur. Call your caregiver if you have any problems or questions after your procedure. HOME CARE INSTRUCTIONS  Things you can do to ease any discomfort  after your procedure include:  Drinking enough water and fluids to keep your urine clear or pale yellow.  Taking a warm bath to relieve any burning feelings. SEEK IMMEDIATE MEDICAL CARE IF:   You have an increase in blood in your urine.  You notice blood clots in your urine.  You have difficulty passing urine.  You have the chills.  You have abdominal pain.  You have a fever or persistent symptoms for more than 2-3 days.  You have a fever and your symptoms suddenly get worse. MAKE SURE YOU:   Understand these instructions.  Will watch your condition.  Will get help right away if you are not doing well  or get worse.     Post Anesthesia Home Care Instructions  Activity: Get plenty of rest for the remainder of the day. A responsible adult should stay with you for 24 hours following the procedure.  For the next 24 hours, DO NOT: -Drive a car -Paediatric nurse -Drink alcoholic beverages -Take any medication unless instructed by your physician -Make any legal decisions or sign important papers.  Meals: Start with liquid foods such as gelatin or soup. Progress to regular foods as tolerated. Avoid greasy, spicy, heavy foods. If nausea and/or vomiting occur, drink only clear liquids until the nausea and/or vomiting subsides. Call your physician if vomiting continues.  Special Instructions/Symptoms: Your throat may feel dry or sore from the anesthesia or the breathing tube placed in your throat during surgery. If this causes discomfort, gargle with warm salt water. The discomfort should disappear within 24 hours.  If you had a scopolamine patch placed behind your ear for the management of post- operative nausea and/or vomiting:  1. The medication in the patch is effective for 72 hours, after which it should be removed.  Wrap patch in a tissue and discard in the trash. Wash hands thoroughly with soap and water. 2. You may remove the patch earlier than 72 hours if you  experience unpleasant side effects which may include dry mouth, dizziness or visual disturbances. 3. Avoid touching the patch. Wash your hands with soap and water after contact with the patch.

## 2015-02-11 NOTE — Anesthesia Postprocedure Evaluation (Signed)
  Anesthesia Post-op Note  Patient: Ronald Atkins  Procedure(s) Performed: Procedure(s) (LRB): TRANSURETHRAL RESECTION OF BLADDER TUMOR (TURBT)  (N/A)  Patient Location: PACU  Anesthesia Type: General  Level of Consciousness: awake and alert   Airway and Oxygen Therapy: Patient Spontanous Breathing  Post-op Pain: mild  Post-op Assessment: Post-op Vital signs reviewed, Patient's Cardiovascular Status Stable, Respiratory Function Stable, Patent Airway and No signs of Nausea or vomiting  Last Vitals:  Filed Vitals:   02/11/15 1023  BP: 137/89  Pulse: 66  Temp: 36.3 C  Resp: 16    Post-op Vital Signs: stable   Complications: No apparent anesthesia complications

## 2015-02-11 NOTE — Interval H&P Note (Signed)
History and Physical Interval Note:  02/11/2015 7:27 AM  Ronald Atkins  has presented today for surgery, with the diagnosis of BLADDER NEOPLASM  The various methods of treatment have been discussed with the patient and family. After consideration of risks, benefits and other options for treatment, the patient has consented to  Procedure(s) with comments: TRANSURETHRAL RESECTION OF BLADDER TUMOR (TURBT) POSSIBLE URETEROSCOPY STENT (N/A) - POSTO MITOMYCIN C URETEROSCOPY (N/A) as a surgical intervention . Discussed specific role, nature, r/b of Ronald Atkins. I discussed with the patient and his wife the nature, potential benefits, risks and alternatives to the above, including side effects of the proposed treatment, the likelihood of the patient achieving the goals of the procedure, and any potential problems that might occur during the procedure or recuperation. All questions answered. Patient elects to proceed.  The patient's history has been reviewed, patient examined, no change in status, stable for surgery.  I have reviewed the patient's chart and labs. Discussed bladder perforation, need for foley/ureteral stents, restaging among others.    Oyindamola Key

## 2015-02-11 NOTE — Anesthesia Procedure Notes (Signed)
Procedure Name: LMA Insertion Date/Time: 02/11/2015 7:37 AM Performed by: Wanita Chamberlain Pre-anesthesia Checklist: Patient identified, Timeout performed, Emergency Drugs available, Suction available and Patient being monitored Patient Re-evaluated:Patient Re-evaluated prior to inductionOxygen Delivery Method: Circle system utilized Preoxygenation: Pre-oxygenation with 100% oxygen Intubation Type: IV induction Ventilation: Mask ventilation without difficulty LMA: LMA inserted LMA Size: 5.0 Number of attempts: 1 Airway Equipment and Method: Bite block Placement Confirmation: breath sounds checked- equal and bilateral and positive ETCO2 Tube secured with: Tape Dental Injury: Teeth and Oropharynx as per pre-operative assessment

## 2015-02-11 NOTE — Anesthesia Preprocedure Evaluation (Addendum)
Anesthesia Evaluation  Patient identified by MRN, date of birth, ID band Patient awake    Reviewed: Allergy & Precautions, NPO status , Patient's Chart, lab work & pertinent test results  Airway Mallampati: II  TM Distance: >3 FB Neck ROM: Full    Dental no notable dental hx. (+) Teeth Intact, Dental Advisory Given,    Pulmonary former smoker,  breath sounds clear to auscultation  Pulmonary exam normal       Cardiovascular Exercise Tolerance: Good negative cardio ROS Normal cardiovascular examRhythm:Regular Rate:Normal     Neuro/Psych negative neurological ROS  negative psych ROS   GI/Hepatic Neg liver ROS, GERD-  Medicated and Controlled,  Endo/Other  negative endocrine ROS  Renal/GU negative Renal ROS  negative genitourinary   Musculoskeletal negative musculoskeletal ROS (+)   Abdominal   Peds negative pediatric ROS (+)  Hematology negative hematology ROS (+)   Anesthesia Other Findings   Reproductive/Obstetrics negative OB ROS                         Anesthesia Physical Anesthesia Plan  ASA: II  Anesthesia Plan: General   Post-op Pain Management:    Induction: Intravenous  Airway Management Planned: LMA  Additional Equipment:   Intra-op Plan:   Post-operative Plan: Extubation in OR  Informed Consent: I have reviewed the patients History and Physical, chart, labs and discussed the procedure including the risks, benefits and alternatives for the proposed anesthesia with the patient or authorized representative who has indicated his/her understanding and acceptance.   Dental advisory given  Plan Discussed with: CRNA and Anesthesiologist  Anesthesia Plan Comments:        Anesthesia Quick Evaluation

## 2015-02-12 ENCOUNTER — Encounter (HOSPITAL_BASED_OUTPATIENT_CLINIC_OR_DEPARTMENT_OTHER): Payer: Self-pay | Admitting: Urology

## 2015-02-27 NOTE — Addendum Note (Signed)
Addendum  created 02/27/15 3754 by Franne Grip, MD   Modules edited: Anesthesia Responsible Staff

## 2015-03-11 ENCOUNTER — Telehealth: Payer: Self-pay | Admitting: Family Medicine

## 2015-03-11 NOTE — Telephone Encounter (Signed)
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

## 2015-08-21 ENCOUNTER — Ambulatory Visit
Admission: RE | Admit: 2015-08-21 | Discharge: 2015-08-21 | Disposition: A | Discharge status: Home / Self Care | Payer: BLUE CROSS/BLUE SHIELD | Source: Ambulatory Visit | Attending: Family Medicine | Admitting: Family Medicine

## 2015-08-21 ENCOUNTER — Other Ambulatory Visit: Payer: Self-pay | Admitting: Family Medicine

## 2015-08-21 DIAGNOSIS — R194 Change in bowel habit: Secondary | ICD-10-CM

## 2015-08-21 DIAGNOSIS — F17200 Nicotine dependence, unspecified, uncomplicated: Secondary | ICD-10-CM

## 2016-02-20 ENCOUNTER — Other Ambulatory Visit: Payer: Self-pay

## 2016-08-05 ENCOUNTER — Other Ambulatory Visit: Payer: Self-pay | Admitting: Urology

## 2016-08-13 ENCOUNTER — Encounter (HOSPITAL_BASED_OUTPATIENT_CLINIC_OR_DEPARTMENT_OTHER): Payer: Self-pay | Admitting: *Deleted

## 2016-08-13 NOTE — Progress Notes (Signed)
To [facility] at 0600- Hg on arrival.

## 2016-08-18 NOTE — H&P (Signed)
Office Visit Report     07/30/2016   --------------------------------------------------------------------------------   Ronald Atkins  MRN: [id number]  PRIMARY CARE:    DOB: Sep 21, 1955, 61 year old Male  REFERRING:    SSN:   PROVIDER:  [name], M.D.    LOCATION:  [facility] [phone]   --------------------------------------------------------------------------------   CC: I have bladder cancer that has been treated.  HPI: Ronald Atkins is a 61 year-old male established patient who is here for follow-up of bladder cancer treatment.  His bladder cancer was superficial and limitied to the bladder lining. His bladder cancer was not muscle invasive.   He did have a TURBT. His last bladder tumor was resected approximately 01/27/2015. He has had the following number of bladder resections: 1.   He has not had blood in his urine recently.   His last cysto was 08/06/2015. His last radiologic test to evaluate the kidneys was approximately 11/27/2014.   Aug 2016 - low-grade TA, right bladder s/p TURBT. No mitomycin-C given due to very thin area. Required dilation of the fossa navicularis. He is still smoking some and drinks a pot of coffee each day. He is a [employer] driver.  -Aug [id number] - bladder bx - posterior erythema - B9.   Last upper tract: Aug 2017 RGP's; June 2016 CT IV - novant  Last cytology: Feb 2017 - negative   He's been well. No dysuria or gross hematuria. He's smoking on and off.     ALLERGIES: OxyCODONE HCl TABS    MEDICATIONS: Men 50 Plus Multivitamin  Vitamin D3     GU PSH: Cystoscopy - 02/05/2016 Cystoscopy TURBT <2 cm - 02/20/2016      PSH Notes: No Surgical Problems   NON-GU PSH: None   GU PMH: Bladder Cancer Lateral - 02/05/2016, Malignant neoplasm of lateral wall of urinary bladder, - 08/06/2015 Bladder, Neoplasm of Unspecified behavior, Bladder neoplasm - 02/14/2015 Gross hematuria, Gross hematuria - 02/14/2015    NON-GU PMH: Encounter for  general adult medical examination without abnormal findings, Encounter for preventive health examination - 01/10/2015    FAMILY HISTORY: heart murmur - Runs In Family hepatic cirrhosis - Runs In Family malignant neoplasm - Runs In Family   SOCIAL HISTORY: Marital Status: Married Current Smoking Status: Patient smokes occasionally.  Types of alcohol consumed: Beer.  Drinks 4+ caffeinated drinks per day.     Notes: Former smoker, Mother deceased, Two children, Use of non-nicotine containing substance in combustion-free vaporization device, Daily caffeine consumption, more than 8 servings a day, Married, Occasional alcohol use, Recently stopped smoking, Father deceased   REVIEW OF SYSTEMS:    GU Review Male:   Patient denies frequent urination, hard to postpone urination, burning/ pain with urination, get up at night to urinate, leakage of urine, stream starts and stops, trouble starting your stream, have to strain to urinate , erection problems, and penile pain.  Gastrointestinal (Upper):   Patient denies nausea, vomiting, and indigestion/ heartburn.  Gastrointestinal (Lower):   Patient denies diarrhea and constipation.  Constitutional:   Patient denies fever, night sweats, weight loss, and fatigue.  Skin:   Patient denies skin rash/ lesion and itching.  Eyes:   Patient denies blurred vision and double vision.  Ears/ Nose/ Throat:   Patient denies sore throat and sinus problems.  Hematologic/Lymphatic:   Patient denies swollen glands and easy bruising.  Cardiovascular:   Patient denies leg swelling and chest pains.  Respiratory:   Patient denies cough and shortness of breath.  Endocrine:   Patient denies excessive thirst.  Musculoskeletal:   Patient denies back pain and joint pain.  Neurological:   Patient denies headaches and dizziness.  Psychologic:   Patient denies depression and anxiety.   VITAL SIGNS:      07/30/2016 01:45 PM  Weight 205 lb / 92.99 kg  Height 69 in / 175.26 cm  BP  125/85 mmHg  Pulse 64 /min  Temperature 98.2 F / 37 C  BMI 30.3 kg/m   GU PHYSICAL EXAMINATION:    Scrotum: No lesions. No edema. No cysts. No warts.  Urethral Meatus: Normal size. No lesion, no wart, no discharge, no polyp. Normal location.  Penis: Circumcised, no warts, no cracks. No dorsal Peyronie's plaques, no left corporal Peyronie's plaques, no right corporal Peyronie's plaques, no scarring, no warts. No balanitis, no meatal stenosis.   MULTI-SYSTEM PHYSICAL EXAMINATION:    Constitutional: Well-nourished. No physical deformities. Normally developed. Good grooming.  Neck: Neck symmetrical, not swollen. Normal tracheal position.  Respiratory: No labored breathing, no use of accessory muscles.   Cardiovascular: Normal temperature, normal extremity pulses, no swelling, no varicosities.  Skin: No paleness, no jaundice, no cyanosis. No lesion, no ulcer, no rash.  Neurologic / Psychiatric: Oriented to time, oriented to place, oriented to person. No depression, no anxiety, no agitation.     PAST DATA REVIEWED:  Source Of History:  Patient   11/19/14  PSA  Total PSA 0.93     PROCEDURES:         Flexible Cystoscopy - 52000  Risks, benefits, and some of the potential complications of the procedure were discussed with the patient. All questions were answered. Informed consent was obtained. Antibiotic prophylaxis was given -- Cipro. Sterile technique and intraurethral analgesia were used.  Meatus:  Normal size. Normal location. Normal condition.  Urethra:  No strictures.  External Sphincter:  Normal.  Verumontanum:  Normal.  Prostate:  Non-obstructing. No hyperplasia.  Bladder Neck:  Non-obstructing.  Ureteral Orifices:  Normal location. Normal size. Normal shape. Effluxed clear urine.  Bladder:  A posterior wall tumor. 1/2 cm tumor. No trabeculation. Normal mucosa. No stones.      The lower urinary tract was carefully examined. The procedure was well-tolerated and without  complications. Antibiotic instructions were given. Instructions were given to call the office immediately for bloody urine, difficulty urinating, painful urination, fever, chills, nausea, vomiting or other illness. The patient stated that he understood these instructions and would comply with them.         Urinalysis w/Scope Dipstick Dipstick Cont'd Micro  Color: Yellow Bilirubin: Neg WBC/hpf: NS (Not Seen)  Appearance: Clear Ketones: Neg RBC/hpf: 3 - 10/hpf  Specific Gravity: 1.025 Blood: 2+ Bacteria: NS (Not Seen)  pH: 5.5 Protein: Trace Cystals: NS (Not Seen)  Glucose: Neg Urobilinogen: 0.2 Casts: NS (Not Seen)    Nitrites: Neg Trichomonas: Not Present    Leukocyte Esterase: Neg Mucous: Not Present      Epithelial Cells: NS (Not Seen)      Yeast: NS (Not Seen)      Sperm: Not Present    ASSESSMENT:      ICD-10 Details  1 GU:   Bladder Cancer Lateral - C67.2    PLAN:           Schedule Return Visit/Planned Activity: Next Available Appointment - Office Visit, Schedule Surgery          Document Letter(s):  Created for Patient: Clinical Summary  Notes:   Bladder cancer-he appears to have an early papillary tumor posterior superiorly. Discussed the nature risks and benefits of bladder biopsy fulguration and epirubicin and he elects to proceed. Discussed again link between smoking and urologic cancers and recommended cessation.    * Signed by [name], M.D. on 07/30/16 at 4:39 PM (EST)*     The information contained in this medical record document is considered private and confidential patient information. This information can only be used for the medical diagnosis and/or medical services that are being provided by the patient's selected caregivers. This information can only be distributed outside of the patient's care if the patient agrees and signs waivers of authorization for this information to be sent to an outside source or route.

## 2016-08-19 NOTE — Anesthesia Preprocedure Evaluation (Addendum)
Anesthesia Evaluation  Patient identified by MRN, date of birth, ID band Patient awake    Reviewed: Allergy & Precautions, H&P , Patient's Chart, lab work & pertinent test results, reviewed documented beta blocker date and time   Airway Mallampati: II  TM Distance: >3 FB Neck ROM: full    Dental no notable dental hx. (+) Caps, Implants   Pulmonary former smoker,    Pulmonary exam normal breath sounds clear to auscultation       Cardiovascular  Rhythm:regular Rate:Normal     Neuro/Psych    GI/Hepatic   Endo/Other    Renal/GU      Musculoskeletal   Abdominal   Peds  Hematology   Anesthesia Other Findings Upper front bridge loose; discussed dental concerns  Reproductive/Obstetrics                            Anesthesia Physical Anesthesia Plan  ASA: II  Anesthesia Plan: General   Post-op Pain Management:    Induction: Intravenous  Airway Management Planned: LMA  Additional Equipment:   Intra-op Plan:   Post-operative Plan:   Informed Consent: I have reviewed the patients History and Physical, chart, labs and discussed the procedure including the risks, benefits and alternatives for the proposed anesthesia with the patient or authorized representative who has indicated his/her understanding and acceptance.   Dental Advisory Given and Dental advisory given  Plan Discussed with: CRNA and Surgeon  Anesthesia Plan Comments: (Discussed GA with LMA, possible sore throat, potential need to switch to ETT, N/V, pulmonary aspiration. Questions answered. )        Anesthesia Quick Evaluation

## 2016-08-20 ENCOUNTER — Encounter (HOSPITAL_BASED_OUTPATIENT_CLINIC_OR_DEPARTMENT_OTHER)
Admission: RE | Disposition: A | Discharge status: Home / Self Care | Payer: Self-pay | Source: Ambulatory Visit | Attending: Urology

## 2016-08-20 ENCOUNTER — Ambulatory Visit (HOSPITAL_BASED_OUTPATIENT_CLINIC_OR_DEPARTMENT_OTHER)
Admission: RE | Admit: 2016-08-20 | Discharge: 2016-08-20 | Disposition: A | Discharge status: Home / Self Care | Payer: BLUE CROSS/BLUE SHIELD | Source: Ambulatory Visit | Attending: Urology | Admitting: Urology

## 2016-08-20 ENCOUNTER — Ambulatory Visit (HOSPITAL_BASED_OUTPATIENT_CLINIC_OR_DEPARTMENT_OTHER): Payer: BLUE CROSS/BLUE SHIELD | Admitting: Anesthesiology

## 2016-08-20 ENCOUNTER — Encounter (HOSPITAL_BASED_OUTPATIENT_CLINIC_OR_DEPARTMENT_OTHER): Payer: Self-pay | Admitting: *Deleted

## 2016-08-20 DIAGNOSIS — F17200 Nicotine dependence, unspecified, uncomplicated: Secondary | ICD-10-CM | POA: Diagnosis not present

## 2016-08-20 DIAGNOSIS — Z8551 Personal history of malignant neoplasm of bladder: Secondary | ICD-10-CM | POA: Diagnosis not present

## 2016-08-20 DIAGNOSIS — D494 Neoplasm of unspecified behavior of bladder: Secondary | ICD-10-CM | POA: Diagnosis present

## 2016-08-20 DIAGNOSIS — Z7982 Long term (current) use of aspirin: Secondary | ICD-10-CM | POA: Diagnosis not present

## 2016-08-20 DIAGNOSIS — Z79899 Other long term (current) drug therapy: Secondary | ICD-10-CM | POA: Insufficient documentation

## 2016-08-20 DIAGNOSIS — D090 Carcinoma in situ of bladder: Secondary | ICD-10-CM | POA: Diagnosis not present

## 2016-08-20 DIAGNOSIS — D414 Neoplasm of uncertain behavior of bladder: Secondary | ICD-10-CM

## 2016-08-20 HISTORY — PX: CYSTOSCOPY WITH BIOPSY: SHX5122

## 2016-08-20 LAB — POCT HEMOGLOBIN-HEMACUE: Hemoglobin: 15.8 g/dL (ref 13.0–17.0)

## 2016-08-20 SURGERY — CYSTOSCOPY, WITH BIOPSY
Anesthesia: General | Site: Bladder

## 2016-08-20 MED ORDER — LIDOCAINE HCL 2 % EX GEL
CUTANEOUS | Status: DC | PRN
  Administered 2016-08-20: 1

## 2016-08-20 MED ORDER — MIDAZOLAM HCL 5 MG/5ML IJ SOLN
INTRAMUSCULAR | Status: DC | PRN
  Administered 2016-08-20: 2 mg via INTRAVENOUS

## 2016-08-20 MED ORDER — CEFAZOLIN SODIUM-DEXTROSE 2-4 GM/100ML-% IV SOLN
2.0000 g | INTRAVENOUS | Status: AC
  Administered 2016-08-20: 2 g via INTRAVENOUS
  Filled 2016-08-20: qty 100

## 2016-08-20 MED ORDER — ONDANSETRON HCL 4 MG/2ML IJ SOLN
INTRAMUSCULAR | Status: AC
  Filled 2016-08-20: qty 2

## 2016-08-20 MED ORDER — LIDOCAINE 2% (20 MG/ML) 5 ML SYRINGE
INTRAMUSCULAR | Status: DC | PRN
  Administered 2016-08-20: 100 mg via INTRAVENOUS

## 2016-08-20 MED ORDER — CEFAZOLIN IN D5W 1 GM/50ML IV SOLN
1.0000 g | INTRAVENOUS | Status: DC
  Filled 2016-08-20: qty 50

## 2016-08-20 MED ORDER — PROPOFOL 10 MG/ML IV BOLUS
INTRAVENOUS | Status: AC
  Filled 2016-08-20: qty 20

## 2016-08-20 MED ORDER — SODIUM CHLORIDE 0.9 % IV SOLN
50.0000 mg | Freq: Once | INTRAVENOUS | Status: AC
  Administered 2016-08-20: 50 mg via INTRAVESICAL
  Filled 2016-08-20 (×2): qty 25

## 2016-08-20 MED ORDER — FENTANYL CITRATE (PF) 100 MCG/2ML IJ SOLN
INTRAMUSCULAR | Status: DC | PRN
  Administered 2016-08-20 (×2): 50 ug via INTRAVENOUS

## 2016-08-20 MED ORDER — FENTANYL CITRATE (PF) 100 MCG/2ML IJ SOLN
25.0000 ug | INTRAMUSCULAR | Status: DC | PRN
  Filled 2016-08-20: qty 1

## 2016-08-20 MED ORDER — DEXAMETHASONE SODIUM PHOSPHATE 4 MG/ML IJ SOLN
INTRAMUSCULAR | Status: DC | PRN
  Administered 2016-08-20: 10 mg via INTRAVENOUS

## 2016-08-20 MED ORDER — CEFAZOLIN SODIUM-DEXTROSE 2-4 GM/100ML-% IV SOLN
INTRAVENOUS | Status: AC
  Filled 2016-08-20: qty 100

## 2016-08-20 MED ORDER — ONDANSETRON HCL 4 MG/2ML IJ SOLN
INTRAMUSCULAR | Status: DC | PRN
  Administered 2016-08-20: 4 mg via INTRAVENOUS

## 2016-08-20 MED ORDER — DEXAMETHASONE SODIUM PHOSPHATE 10 MG/ML IJ SOLN
INTRAMUSCULAR | Status: AC
  Filled 2016-08-20: qty 1

## 2016-08-20 MED ORDER — TRAMADOL HCL 50 MG PO TABS: 50.0000 mg | ORAL_TABLET | Freq: Four times a day (QID) | ORAL | 0 refills | Status: DC | PRN

## 2016-08-20 MED ORDER — LIDOCAINE 2% (20 MG/ML) 5 ML SYRINGE
INTRAMUSCULAR | Status: AC
  Filled 2016-08-20: qty 5

## 2016-08-20 MED ORDER — FENTANYL CITRATE (PF) 100 MCG/2ML IJ SOLN
INTRAMUSCULAR | Status: AC
  Filled 2016-08-20: qty 2

## 2016-08-20 MED ORDER — PROPOFOL 10 MG/ML IV BOLUS
INTRAVENOUS | Status: DC | PRN
  Administered 2016-08-20: 200 mg via INTRAVENOUS

## 2016-08-20 MED ORDER — LACTATED RINGERS IV SOLN
INTRAVENOUS | Status: DC
  Administered 2016-08-20: 06:00:00 via INTRAVENOUS
  Filled 2016-08-20: qty 1000

## 2016-08-20 MED ORDER — MIDAZOLAM HCL 2 MG/2ML IJ SOLN
INTRAMUSCULAR | Status: AC
  Filled 2016-08-20: qty 2

## 2016-08-20 MED ORDER — STERILE WATER FOR IRRIGATION IR SOLN
Status: DC | PRN
  Administered 2016-08-20: 3000 mL

## 2016-08-20 SURGICAL SUPPLY — 16 items
BAG DRAIN URO-CYSTO SKYTR STRL (DRAIN) ×3 IMPLANT
BAG URINE DRAINAGE (UROLOGICAL SUPPLIES) ×3 IMPLANT
CATH FOLEY 2WAY SLVR  5CC 16FR (CATHETERS) ×2
CATH FOLEY 2WAY SLVR 5CC 16FR (CATHETERS) ×1 IMPLANT
CLOTH BEACON ORANGE TIMEOUT ST (SAFETY) ×3 IMPLANT
ELECT REM PT RETURN 9FT ADLT (ELECTROSURGICAL) ×3
ELECTRODE REM PT RTRN 9FT ADLT (ELECTROSURGICAL) ×1 IMPLANT
GLOVE BIO SURGEON STRL SZ7.5 (GLOVE) ×6 IMPLANT
GOWN STRL REUS W/TWL LRG LVL3 (GOWN DISPOSABLE) ×3 IMPLANT
GOWN STRL REUS W/TWL XL LVL3 (GOWN DISPOSABLE) ×3 IMPLANT
KIT RM TURNOVER CYSTO AR (KITS) ×3 IMPLANT
MANIFOLD NEPTUNE II (INSTRUMENTS) ×3 IMPLANT
PACK CYSTO (CUSTOM PROCEDURE TRAY) ×3 IMPLANT
TUBE CONNECTING 12'X1/4 (SUCTIONS) ×1
TUBE CONNECTING 12X1/4 (SUCTIONS) ×2 IMPLANT
WATER STERILE IRR 3000ML UROMA (IV SOLUTION) ×3 IMPLANT

## 2016-08-20 NOTE — Anesthesia Postprocedure Evaluation (Signed)
Anesthesia Post Note  Patient: Ronald Atkins  Procedure(s) Performed: Procedure(s) (LRB): CYSTOSCOPY WITH BIOPSY AND FULGURATION INSTILL EPIRUBICIN (N/A)  Patient location during evaluation: PACU Anesthesia Type: General Level of consciousness: sedated Pain management: satisfactory to patient Vital Signs Assessment: post-procedure vital signs reviewed and stable Respiratory status: spontaneous breathing Cardiovascular status: stable Anesthetic complications: no       Last Vitals:  Vitals:   08/20/16 0830 08/20/16 0845  BP: (!) 127/98 (!) 134/103  Pulse: 71 65  Resp: 19 15  Temp:      Last Pain:  Vitals:   08/20/16 0845  TempSrc:   PainSc: [place]

## 2016-08-20 NOTE — Interval H&P Note (Signed)
History and Physical Interval Note:  08/20/2016 7:29 AM  Ronald Atkins  has presented today for surgery, with the diagnosis of bladder cancer  The various methods of treatment have been discussed with the patient and family. After consideration of risks, benefits and other options for treatment, the patient has consented to  Procedure(s): CYSTOSCOPY WITH BIOPSY AND FULGURATION INSTILL EPIRUBICIN (N/A) as a surgical intervention .  The patient's history has been reviewed, patient examined, no change in status, stable for surgery.  I have reviewed the patient's chart and labs. Discussed post-op course, possible foley.  Questions were answered to the patient's satisfaction.     Dustin Burrill

## 2016-08-20 NOTE — Discharge Instructions (Signed)
°Post Anesthesia Home Care Instructions ° °Activity: °Get plenty of rest for the remainder of the day. A responsible adult should stay with you for 24 hours following the procedure.  °For the next 24 hours, DO NOT: °-Drive a car °-Operate machinery °-Drink alcoholic beverages °-Take any medication unless instructed by your physician °-Make any legal decisions or sign important papers. ° °Meals: °Start with liquid foods such as gelatin or soup. Progress to regular foods as tolerated. Avoid greasy, spicy, heavy foods. If nausea and/or vomiting occur, drink only clear liquids until the nausea and/or vomiting subsides. Call your physician if vomiting continues. ° °Special Instructions/Symptoms: °Your throat may feel dry or sore from the anesthesia or the breathing tube placed in your throat during surgery. If this causes discomfort, gargle with warm salt water. The discomfort should disappear within 24 hours. ° °If you had a scopolamine patch placed behind your ear for the management of post- operative nausea and/or vomiting: ° °1. The medication in the patch is effective for 72 hours, after which it should be removed.  Wrap patch in a tissue and discard in the trash. Wash hands thoroughly with soap and water. °2. You may remove the patch earlier than 72 hours if you experience unpleasant side effects which may include dry mouth, dizziness or visual disturbances. °3. Avoid touching the patch. Wash your hands with soap and water after contact with the patch. °  °Cystoscopy, Care After °Refer to this sheet in the next few weeks. These instructions provide you with information about caring for yourself after your procedure. Your health care provider may also give you more specific instructions. Your treatment has been planned according to current medical practices, but problems sometimes occur. Call your health care provider if you have any problems or questions after your procedure. °What can I expect after the  procedure? °After the procedure, it is common to have: °· Mild pain when you urinate. Pain should stop within a few minutes after you urinate. This may last for up to 1 week. °· A small amount of blood in your urine for several days. °· Feeling like you need to urinate but producing only a small amount of urine. °Follow these instructions at home: °  °Medicines °· Take over-the-counter and prescription medicines only as told by your health care provider. °· If you were prescribed an antibiotic medicine, take it as told by your health care provider. Do not stop taking the antibiotic even if you start to feel better. °General instructions °· Return to your normal activities as told by your health care provider. Ask your health care provider what activities are safe for you. °· Do not drive for 24 hours if you received a sedative. °· Watch for any blood in your urine. If the amount of blood in your urine increases, call your health care provider. °· Follow instructions from your health care provider about eating or drinking restrictions. °· If a tissue sample was removed for testing (biopsy) during your procedure, it is your responsibility to get your test results. Ask your health care provider or the department performing the test when your results will be ready. °· Drink enough fluid to keep your urine clear or pale yellow. °· Keep all follow-up visits as told by your health care provider. This is important. °Contact a health care provider if: °· You have pain that gets worse or does not get better with medicine, especially pain when you urinate. °· You have difficulty urinating. °Get help right   away if: °· You have more blood in your urine. °· You have blood clots in your urine. °· You have abdominal pain. °· You have a fever or chills. °· You are unable to urinate. °This information is not intended to replace advice given to you by your health care provider. Make sure you discuss any questions you have with your  health care provider. °Document Released: 01/01/2005 Document Revised: 11/20/2015 Document Reviewed: 05/01/2015 °Elsevier Interactive Patient Education © 2017 [employer] ° °

## 2016-08-20 NOTE — Op Note (Signed)
Preoperative diagnosis: Bladder neoplasm Postoperative diagnosis: Bladder neoplasm  Procedure: Cystoscopy with bladder biopsy and fulguration, 1.5 cm  Surgeon: Junious Silk  Anesthesia: Gen.  Indication for procedure: 61 year old white male with history of low-grade TA bladder cancer. He had several recurrences in the bladder and was brought for exam under anesthesia, cystoscopy with biopsy and fulguration. Installation of epirubicin.  Findings: On exam under anesthesia the penis was circumcised and without mass or lesion. The testicles were descended bilaterally and palpably normal. On digital rectal exam the prostate was proximally 30 g, smooth without hard area or nodule.  On cystoscopy the urethra and prostatic urethra appeared normal. The trigone and ureteral orifices were in their normal orthotopic position with clear efflux. There were no stones or foreign bodies in the bladder. There are several small early recurrences. One of the dome which aren't seen in the office, 1 posterior and one on the left anterior. Just lateral to the left anterior behind it was another area of early recurrence.  Description of procedure: After consent was obtained patient brought to the operating room. After adequate anesthesia he is placed in lithotomy position and prepped and draped in the usual sterile fashion. A timeout was performed to confirm the patient and procedure. An exam under anesthesia was performed. The cystoscope was then passed per urethra and the bladder carefully inspected with the 30 and 70 lens. I then used the rigid biopsy forceps to biopsy the left anterior bladder tumor. I fulgurated the spot as well as the early recurrence just behind and lateral to it. That was the largest area of about 15 mm. I then biopsied the posterior site and fulgurated it. Just lateral to it there was another area that looked like some early recurrence in the small area was fulgurated. Then I biopsied the dome and  fulgurated the spot. I did not appreciate any other abnormal areas. Hemostasis was excellent at low-pressure. The bladder was filled with some fluid and the scope removed. Lidocaine jelly instilled per urethra and a 16 French Foley placed. It was left gravity drainage with clear urine. The patient was awakened and taken to the recovery room in stable condition.  Complications: None  Blood loss: Minimal  Specimens to pathology: #1 left bladder biopsy #2 posterior bladder biopsy #3 dome bladder biopsy  Installation of mitomycin C in PACU: 50 mg of mitomycin and 50 mL normal saline was instilled per urethra and left indwelling in the bladder for 50 minutes. The bladder was then drained and the foley removed.

## 2016-08-20 NOTE — Anesthesia Procedure Notes (Signed)
Procedure Name: LMA Insertion Date/Time: 08/20/2016 7:39 AM Performed by: Denna Haggard D Pre-anesthesia Checklist: Patient identified, Emergency Drugs available, Suction available and Patient being monitored Patient Re-evaluated:Patient Re-evaluated prior to inductionOxygen Delivery Method: Circle system utilized Preoxygenation: Pre-oxygenation with 100% oxygen Intubation Type: IV induction Ventilation: Mask ventilation without difficulty LMA: LMA inserted LMA Size: 4.0 Number of attempts: 1 Airway Equipment and Method: Bite block Placement Confirmation: positive ETCO2 Tube secured with: Tape Dental Injury: Teeth and Oropharynx as per pre-operative assessment

## 2016-08-20 NOTE — Transfer of Care (Signed)
Immediate Anesthesia Transfer of Care Note  Patient: Ronald Atkins  Procedure(s) Performed: Procedure(s) (LRB): CYSTOSCOPY WITH BIOPSY AND FULGURATION INSTILL EPIRUBICIN (N/A)  Patient Location: PACU  Anesthesia Type: General  Level of Consciousness: awake, oriented, sedated and patient cooperative  Airway & Oxygen Therapy: Patient Spontanous Breathing and Patient connected to face mask oxygen  Post-op Assessment: Report given to PACU RN and Post -op Vital signs reviewed and stable  Post vital signs: Reviewed and stable  Complications: No apparent anesthesia complications Last Vitals:  Vitals:   08/20/16 0600 08/20/16 0823  BP: 124/82 120/85  Pulse: 65 73  Resp: 16 12  Temp: 37.1 C 36.6 C

## 2016-08-23 ENCOUNTER — Encounter (HOSPITAL_BASED_OUTPATIENT_CLINIC_OR_DEPARTMENT_OTHER): Payer: Self-pay | Admitting: Urology

## 2017-01-07 NOTE — Addendum Note (Signed)
Addendum  created 01/07/17 1530 by Lyndle Herrlich, MD   Sign clinical note

## 2017-01-07 NOTE — Anesthesia Postprocedure Evaluation (Signed)
Anesthesia Post Note  Patient: Ronald Atkins  Procedure(s) Performed: Procedure(s) (LRB): CYSTOSCOPY WITH BIOPSY AND FULGURATION INSTILL EPIRUBICIN (N/A)     Anesthesia Post Evaluation  Last Vitals:  Vitals:   08/20/16 0930 08/20/16 1041  BP: (!) 152/129 (!) 157/91  Pulse: 72 (!) 58  Resp: (!) 25 16  Temp:  36.9 C    Last Pain:  Vitals:   08/23/16 1209  TempSrc:   PainSc: 0-No pain                 Aseneth Hack EDWARD

## 2018-02-24 ENCOUNTER — Other Ambulatory Visit: Payer: Self-pay | Admitting: Urology

## 2018-03-02 ENCOUNTER — Encounter (HOSPITAL_BASED_OUTPATIENT_CLINIC_OR_DEPARTMENT_OTHER): Payer: Self-pay

## 2018-03-06 ENCOUNTER — Other Ambulatory Visit: Payer: Self-pay

## 2018-03-06 ENCOUNTER — Encounter (HOSPITAL_BASED_OUTPATIENT_CLINIC_OR_DEPARTMENT_OTHER): Payer: Self-pay

## 2018-03-06 NOTE — Progress Notes (Signed)
Spoke with:  Ronald Atkins NPO:  After Midnight, no gum, candy, or mints   Arrival time:  1015AM Labs: N/A AM medications: None Pre op orders: Yes Ride home:  Ronald Atkins (wife) [phone]

## 2018-03-09 NOTE — H&P (Signed)
Office Visit Report     02/17/2018   --------------------------------------------------------------------------------   Ronald Atkins  MRN: [id number]  PRIMARY CARE:    DOB: May 29, 1956, 62 year old Male  REFERRING:    SSN:   PROVIDER:  [name], M.D.    LOCATION:  [facility] [phone]   --------------------------------------------------------------------------------   CC: I have bladder cancer that has been treated.  HPI: Ronald Atkins is a 62 year-old male established patient who is here for follow-up of bladder cancer treatment.  His bladder cancer was superficial and limitied to the bladder lining. His bladder cancer was not muscle invasive.   He did have a TURBT. His last bladder tumor was resected approximately 01/27/2015. He has had the following number of bladder resections: 1.   He has not had blood in his urine recently.   His last cysto was 08/06/2015. His last radiologic test to evaluate the kidneys was approximately 11/27/2014.   Aug 2016 - low-grade TA, right bladder s/p TURBT. No mitomycin-C given due to very thin area. Required dilation of the fossa navicularis. He is still smoking some and drinks a pot of coffee each day. He is a [employer] driver.  -Aug 8563 - bladder bx - posterior erythema - B9.  -Feb 2018 - bladder bx - LG in situ   Last upper tract: Aug 2017 RGP's; June 2016 CT IV - [facility]  Last cytology: Feb 2017 - negative   He's been well. 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     ALLERGIES: OxyCODONE HCl TABS    MEDICATIONS: Viagra 50 mg tablet take 1/2 to 1 tablet as needed  Men 50 Plus Multivitamin  Vitamin D3     GU PSH: Cysto Bladder Ureth Biopsy - 08/20/2016 Cystoscopy - 03/18/2017, 11/05/2016, 2018, 2017 Cystoscopy TURBT <2 cm - 02/20/2016      PSH Notes: No Surgical Problems   NON-GU PSH: None   GU PMH: Bladder Cancer Lateral - 03/18/2017, - 11/05/2016, - 2018, - 2017, Malignant neoplasm of lateral  wall of urinary bladder, - 2017 ED due to arterial insufficiency - 11/05/2016 Bladder, Neoplasm of Unspecified behavior, Bladder neoplasm - 2016 Gross hematuria, Gross hematuria - 2016    NON-GU PMH: Encounter for general adult medical examination without abnormal findings, Encounter for preventive health examination - 2016 Cervical disc disorder with radiculopathy, unspecified cervical region    FAMILY HISTORY: heart murmur - Runs In Family hepatic cirrhosis - Runs In Family malignant neoplasm - Runs In Family   SOCIAL HISTORY: Marital Status: Married Preferred Language: English; Race: White Current Smoking Status: Patient smokes occasionally.  Types of alcohol consumed: Beer.  Drinks 4+ caffeinated drinks per day.     Notes: Former smoker, Mother deceased, Two children, Use of non-nicotine containing substance in combustion-free vaporization device, Daily caffeine consumption, more than 8 servings a day, Married, Occasional alcohol use, Recently stopped smoking, Father deceased   REVIEW OF SYSTEMS:    GU Review Male:   Patient denies frequent urination, hard to postpone urination, burning/ pain with urination, get up at night to urinate, leakage of urine, stream starts and stops, trouble starting your stream, have to strain to urinate , erection problems, and penile pain.  Gastrointestinal (Upper):   Patient denies nausea, vomiting, and indigestion/ heartburn.  Gastrointestinal (Lower):   Patient denies diarrhea and constipation.  Constitutional:   Patient denies fever, night sweats, weight loss, and fatigue.  Skin:   Patient denies skin rash/ lesion and  itching.  Eyes:   Patient denies blurred vision and double vision.  Ears/ Nose/ Throat:   Patient denies sore throat and sinus problems.  Hematologic/Lymphatic:   Patient denies swollen glands and easy bruising.  Cardiovascular:   Patient denies leg swelling and chest pains.  Respiratory:   Patient denies cough and shortness of  breath.  Endocrine:   Patient denies excessive thirst.  Musculoskeletal:   Patient denies back pain and joint pain.  Neurological:   Patient denies headaches and dizziness.  Psychologic:   Patient denies depression and anxiety.   VITAL SIGNS:      02/17/2018 02:58 PM  Weight 207 lb / 93.89 kg  Height 68 in / 172.72 cm  BP 120/82 mmHg  Heart Rate 81 /min  BMI 31.5 kg/m   MULTI-SYSTEM PHYSICAL EXAMINATION:    Constitutional: Well-nourished. No physical deformities. Normally developed. Good grooming.  Neck: Neck symmetrical, not swollen. Normal tracheal position.  Respiratory: No labored breathing, no use of accessory muscles.   Cardiovascular: Normal temperature, normal extremity pulses, no swelling, no varicosities.  Skin: No paleness, no jaundice, no cyanosis. No lesion, no ulcer, no rash.  Neurologic / Psychiatric: Oriented to time, oriented to place, oriented to person. No depression, no anxiety, no agitation.  Gastrointestinal: No mass, no tenderness, no rigidity, non obese abdomen.     PAST DATA REVIEWED:  Source Of History:  Patient   11/19/14  PSA  Total PSA 0.93     PROCEDURES:         Flexible Cystoscopy - 52000  Risks, benefits, and some of the potential complications of the procedure were discussed with the patient. All questions were answered. Informed consent was obtained. Antibiotic prophylaxis was given -- Cephalexin. Sterile technique and intraurethral analgesia were used.  Meatus:  Normal size. Normal location. Normal condition.  Urethra:  No strictures.  External Sphincter:  Normal.  Verumontanum:  Normal.  Prostate:  Non-obstructing. No hyperplasia.  Bladder Neck:  Non-obstructing.  Ureteral Orifices:  Normal location. Normal size. Normal shape. Effluxed clear urine.  Bladder:  A few posterior wall tumors. No trabeculation. Normal mucosa. No stones.      The lower urinary tract was carefully examined. The procedure was well-tolerated and without  complications. Antibiotic instructions were given. Instructions were given to call the office immediately for bloody urine, difficulty urinating, painful urination, fever, chills, nausea, vomiting or other illness. The patient stated that he understood these instructions and would comply with them.         Urinalysis w/Scope Dipstick Dipstick Cont'd Micro  Color: Yellow Bilirubin: Neg mg/dL WBC/hpf: NS (Not Seen)  Appearance: Clear Ketones: Neg mg/dL RBC/hpf: 3 - 10/hpf  Specific Gravity: 1.015 Blood: 2+ ery/uL Bacteria: NS (Not Seen)  pH: 6.5 Protein: Neg mg/dL Cystals: NS (Not Seen)  Glucose: Neg mg/dL Urobilinogen: 0.2 mg/dL Casts: NS (Not Seen)    Nitrites: Neg Trichomonas: Not Present    Leukocyte Esterase: Neg leu/uL Mucous: Not Present      Epithelial Cells: NS (Not Seen)      Yeast: NS (Not Seen)      Sperm: Not Present    ASSESSMENT:      ICD-10 Details  1 GU:   Bladder Cancer Lateral - C67.2    PLAN:           Schedule Return Visit/Planned Activity: Next Available Appointment - Schedule Surgery          Document Letter(s):  Created for Patient: Clinical Summary  Notes:   bladder neoplasm -- discussed the nature risk and benefits of TURBT and postoperative gemcitabine. All questions answered. Discussed alternatives. We might need to consider BCG.       ** Signed by [name], M.D. on 02/17/18 at 5:38 PM (EDT)**     The information contained in this medical record document is considered private and confidential patient information. This information can only be used for the medical diagnosis and/or medical services that are being provided by the patient's selected caregivers. This information can only be distributed outside of the patient's care if the patient agrees and signs waivers of authorization for this information to be sent to an outside source or route.

## 2018-03-10 ENCOUNTER — Ambulatory Visit (HOSPITAL_BASED_OUTPATIENT_CLINIC_OR_DEPARTMENT_OTHER): Payer: BLUE CROSS/BLUE SHIELD | Admitting: Anesthesiology

## 2018-03-10 ENCOUNTER — Ambulatory Visit (HOSPITAL_BASED_OUTPATIENT_CLINIC_OR_DEPARTMENT_OTHER)
Admission: RE | Admit: 2018-03-10 | Discharge: 2018-03-10 | Disposition: A | Discharge status: Home / Self Care | Payer: BLUE CROSS/BLUE SHIELD | Source: Ambulatory Visit | Attending: Urology | Admitting: Urology

## 2018-03-10 ENCOUNTER — Encounter (HOSPITAL_BASED_OUTPATIENT_CLINIC_OR_DEPARTMENT_OTHER)
Admission: RE | Disposition: A | Discharge status: Home / Self Care | Payer: Self-pay | Source: Ambulatory Visit | Attending: Urology

## 2018-03-10 ENCOUNTER — Encounter (HOSPITAL_BASED_OUTPATIENT_CLINIC_OR_DEPARTMENT_OTHER): Payer: Self-pay

## 2018-03-10 DIAGNOSIS — C671 Malignant neoplasm of dome of bladder: Secondary | ICD-10-CM | POA: Diagnosis not present

## 2018-03-10 DIAGNOSIS — F17200 Nicotine dependence, unspecified, uncomplicated: Secondary | ICD-10-CM | POA: Insufficient documentation

## 2018-03-10 DIAGNOSIS — C672 Malignant neoplasm of lateral wall of bladder: Secondary | ICD-10-CM | POA: Diagnosis not present

## 2018-03-10 DIAGNOSIS — Z809 Family history of malignant neoplasm, unspecified: Secondary | ICD-10-CM | POA: Insufficient documentation

## 2018-03-10 DIAGNOSIS — Z885 Allergy status to narcotic agent status: Secondary | ICD-10-CM | POA: Diagnosis not present

## 2018-03-10 DIAGNOSIS — M1990 Unspecified osteoarthritis, unspecified site: Secondary | ICD-10-CM | POA: Insufficient documentation

## 2018-03-10 DIAGNOSIS — D414 Neoplasm of uncertain behavior of bladder: Secondary | ICD-10-CM

## 2018-03-10 DIAGNOSIS — C679 Malignant neoplasm of bladder, unspecified: Secondary | ICD-10-CM | POA: Diagnosis present

## 2018-03-10 DIAGNOSIS — R51 Headache: Secondary | ICD-10-CM | POA: Diagnosis not present

## 2018-03-10 DIAGNOSIS — Z8551 Personal history of malignant neoplasm of bladder: Secondary | ICD-10-CM | POA: Insufficient documentation

## 2018-03-10 DIAGNOSIS — C674 Malignant neoplasm of posterior wall of bladder: Secondary | ICD-10-CM | POA: Diagnosis not present

## 2018-03-10 DIAGNOSIS — Z8379 Family history of other diseases of the digestive system: Secondary | ICD-10-CM | POA: Diagnosis not present

## 2018-03-10 DIAGNOSIS — Z79899 Other long term (current) drug therapy: Secondary | ICD-10-CM | POA: Insufficient documentation

## 2018-03-10 HISTORY — DX: Personal history of (healed) traumatic fracture: Z87.81

## 2018-03-10 HISTORY — DX: Solitary pulmonary nodule: R91.1

## 2018-03-10 HISTORY — DX: Unspecified osteoarthritis, unspecified site: M19.90

## 2018-03-10 HISTORY — PX: TRANSURETHRAL RESECTION OF BLADDER TUMOR: SHX2575

## 2018-03-10 HISTORY — DX: Diverticulosis of large intestine without perforation or abscess without bleeding: K57.30

## 2018-03-10 HISTORY — DX: Malignant neoplasm of bladder, unspecified: C67.9

## 2018-03-10 HISTORY — DX: Personal history of other (healed) physical injury and trauma: Z87.828

## 2018-03-10 SURGERY — TURBT (TRANSURETHRAL RESECTION OF BLADDER TUMOR)
Anesthesia: General | Site: Bladder

## 2018-03-10 MED ORDER — FENTANYL CITRATE (PF) 100 MCG/2ML IJ SOLN
INTRAMUSCULAR | Status: AC
  Filled 2018-03-10: qty 2

## 2018-03-10 MED ORDER — CEFAZOLIN SODIUM-DEXTROSE 2-4 GM/100ML-% IV SOLN
2.0000 g | Freq: Once | INTRAVENOUS | Status: AC
  Administered 2018-03-10: 2 g via INTRAVENOUS
  Filled 2018-03-10: qty 100

## 2018-03-10 MED ORDER — FENTANYL CITRATE (PF) 100 MCG/2ML IJ SOLN
25.0000 ug | INTRAMUSCULAR | Status: DC | PRN
  Filled 2018-03-10: qty 1

## 2018-03-10 MED ORDER — ONDANSETRON HCL 4 MG/2ML IJ SOLN
INTRAMUSCULAR | Status: DC | PRN
  Administered 2018-03-10: 4 mg via INTRAVENOUS

## 2018-03-10 MED ORDER — PROPOFOL 10 MG/ML IV BOLUS
INTRAVENOUS | Status: AC
  Filled 2018-03-10: qty 20

## 2018-03-10 MED ORDER — PHENYLEPHRINE 40 MCG/ML (10ML) SYRINGE FOR IV PUSH (FOR BLOOD PRESSURE SUPPORT)
PREFILLED_SYRINGE | INTRAVENOUS | Status: AC
  Filled 2018-03-10: qty 20

## 2018-03-10 MED ORDER — LACTATED RINGERS IV SOLN
INTRAVENOUS | Status: DC
  Administered 2018-03-10: 11:00:00 via INTRAVENOUS
  Administered 2018-03-10: 1000 mL via INTRAVENOUS
  Filled 2018-03-10: qty 1000

## 2018-03-10 MED ORDER — DEXAMETHASONE SODIUM PHOSPHATE 10 MG/ML IJ SOLN
INTRAMUSCULAR | Status: AC
  Filled 2018-03-10: qty 1

## 2018-03-10 MED ORDER — LIDOCAINE 2% (20 MG/ML) 5 ML SYRINGE
INTRAMUSCULAR | Status: AC
  Filled 2018-03-10: qty 5

## 2018-03-10 MED ORDER — GEMCITABINE CHEMO FOR BLADDER INSTILLATION 2000 MG
2000.0000 mg | Freq: Once | INTRAVENOUS | Status: AC
  Administered 2018-03-10: 2000 mg via INTRAVESICAL
  Filled 2018-03-10: qty 2000

## 2018-03-10 MED ORDER — SODIUM CHLORIDE 0.9 % IR SOLN
Status: DC | PRN
  Administered 2018-03-10: 12000 mL

## 2018-03-10 MED ORDER — MIDAZOLAM HCL 5 MG/5ML IJ SOLN
INTRAMUSCULAR | Status: DC | PRN
  Administered 2018-03-10: 2 mg via INTRAVENOUS

## 2018-03-10 MED ORDER — ONDANSETRON HCL 4 MG/2ML IJ SOLN
4.0000 mg | Freq: Four times a day (QID) | INTRAMUSCULAR | Status: DC | PRN
  Filled 2018-03-10: qty 2

## 2018-03-10 MED ORDER — PHENYLEPHRINE 40 MCG/ML (10ML) SYRINGE FOR IV PUSH (FOR BLOOD PRESSURE SUPPORT)
PREFILLED_SYRINGE | INTRAVENOUS | Status: DC | PRN
  Administered 2018-03-10 (×2): 80 ug via INTRAVENOUS

## 2018-03-10 MED ORDER — PROPOFOL 10 MG/ML IV BOLUS
INTRAVENOUS | Status: DC | PRN
  Administered 2018-03-10: 200 mg via INTRAVENOUS

## 2018-03-10 MED ORDER — MIDAZOLAM HCL 2 MG/2ML IJ SOLN
INTRAMUSCULAR | Status: AC
  Filled 2018-03-10: qty 2

## 2018-03-10 MED ORDER — IOHEXOL 300 MG/ML  SOLN
INTRAMUSCULAR | Status: DC | PRN
  Administered 2018-03-10: 12 mL

## 2018-03-10 MED ORDER — DEXAMETHASONE SODIUM PHOSPHATE 10 MG/ML IJ SOLN
INTRAMUSCULAR | Status: DC | PRN
  Administered 2018-03-10: 10 mg via INTRAVENOUS

## 2018-03-10 MED ORDER — ONDANSETRON HCL 4 MG/2ML IJ SOLN
INTRAMUSCULAR | Status: AC
  Filled 2018-03-10: qty 2

## 2018-03-10 MED ORDER — FENTANYL CITRATE (PF) 100 MCG/2ML IJ SOLN
INTRAMUSCULAR | Status: DC | PRN
  Administered 2018-03-10 (×2): 50 ug via INTRAVENOUS

## 2018-03-10 MED ORDER — LIDOCAINE 2% (20 MG/ML) 5 ML SYRINGE
INTRAMUSCULAR | Status: DC | PRN
  Administered 2018-03-10: 100 mg via INTRAVENOUS

## 2018-03-10 SURGICAL SUPPLY — 31 items
BAG DRAIN URO-CYSTO SKYTR STRL (DRAIN) ×3 IMPLANT
BAG URINE DRAINAGE (UROLOGICAL SUPPLIES) ×3 IMPLANT
BAG URINE LEG 19OZ MD ST LTX (BAG) IMPLANT
CATH FOLEY 2WAY SLVR  5CC 18FR (CATHETERS) ×2
CATH FOLEY 2WAY SLVR  5CC 20FR (CATHETERS)
CATH FOLEY 2WAY SLVR  5CC 22FR (CATHETERS)
CATH FOLEY 2WAY SLVR 5CC 18FR (CATHETERS) ×1 IMPLANT
CATH FOLEY 2WAY SLVR 5CC 20FR (CATHETERS) IMPLANT
CATH FOLEY 2WAY SLVR 5CC 22FR (CATHETERS) IMPLANT
CATH URET 5FR 28IN OPEN ENDED (CATHETERS) ×3 IMPLANT
CLOTH BEACON ORANGE TIMEOUT ST (SAFETY) ×3 IMPLANT
DRSG TELFA 3X8 NADH (GAUZE/BANDAGES/DRESSINGS) IMPLANT
ELECT REM PT RETURN 9FT ADLT (ELECTROSURGICAL) ×3
ELECTRODE REM PT RTRN 9FT ADLT (ELECTROSURGICAL) ×1 IMPLANT
EVACUATOR MICROVAS BLADDER (UROLOGICAL SUPPLIES) IMPLANT
GLOVE BIO SURGEON STRL SZ 6.5 (GLOVE) ×2 IMPLANT
GLOVE BIO SURGEON STRL SZ7.5 (GLOVE) ×3 IMPLANT
GLOVE BIO SURGEONS STRL SZ 6.5 (GLOVE) ×1
GLOVE BIOGEL PI IND STRL 6.5 (GLOVE) ×1 IMPLANT
GLOVE BIOGEL PI INDICATOR 6.5 (GLOVE) ×2
GOWN STRL REUS W/TWL LRG LVL3 (GOWN DISPOSABLE) ×6 IMPLANT
HOLDER FOLEY CATH W/STRAP (MISCELLANEOUS) ×3 IMPLANT
IV NS IRRIG 3000ML ARTHROMATIC (IV SOLUTION) ×12 IMPLANT
KIT TURNOVER CYSTO (KITS) ×3 IMPLANT
LOOP CUT BIPOLAR 24F LRG (ELECTROSURGICAL) ×3 IMPLANT
MANIFOLD NEPTUNE II (INSTRUMENTS) ×3 IMPLANT
PACK CYSTO (CUSTOM PROCEDURE TRAY) ×3 IMPLANT
PLUG CATH AND CAP STER (CATHETERS) IMPLANT
TUBE CONNECTING 12'X1/4 (SUCTIONS)
TUBE CONNECTING 12X1/4 (SUCTIONS) IMPLANT
TUBING UROLOGY SET (TUBING) ×3 IMPLANT

## 2018-03-10 NOTE — Transfer of Care (Signed)
Immediate Anesthesia Transfer of Care Note  Patient: Ronald Atkins  Procedure(s) Performed: TRANSURETHRAL RESECTION OF BLADDER TUMOR (TURBT)/ CYSTOSCOPY/WITH BILATERAL RETROGRADES, POST OPERATIVE INSTILLATION OF CHEMOTHERAPY IN PACU (N/A Bladder)  Patient Location: PACU  Anesthesia Type:General  Level of Consciousness: drowsy and responds to stimulation  Airway & Oxygen Therapy: Patient Spontanous Breathing and Patient connected to nasal cannula oxygen  Post-op Assessment: Report given to RN  Post vital signs: Reviewed and stable  Last Vitals: 102/70, 73, 10, 97% Vitals Value Taken Time  BP    Temp    Pulse    Resp    SpO2      Last Pain:  Vitals:   03/10/18 1037  TempSrc:   PainSc: 0-No pain      Patients Stated Pain Goal: 4 (49/82/64 1583)  Complications: No apparent anesthesia complications

## 2018-03-10 NOTE — Discharge Instructions (Signed)
Cystoscopy, Care After °Refer to this sheet in the next few weeks. These instructions provide you with information about caring for yourself after your procedure. Your health care provider may also give you more specific instructions. Your treatment has been planned according to current medical practices, but problems sometimes occur. Call your health care provider if you have any problems or questions after your procedure. °What can I expect after the procedure? °After the procedure, it is common to have: °· Mild pain when you urinate. Pain should stop within a few minutes after you urinate. This may last for up to 1 week. °· A small amount of blood in your urine for several days. °· Feeling like you need to urinate but producing only a small amount of urine. ° °Follow these instructions at home: ° °Medicines °· Take over-the-counter and prescription medicines only as told by your health care provider. °· If you were prescribed an antibiotic medicine, take it as told by your health care provider. Do not stop taking the antibiotic even if you start to feel better. °General instructions ° °· Return to your normal activities as told by your health care provider. Ask your health care provider what activities are safe for you. °· Do not drive for 24 hours if you received a sedative. °· Watch for any blood in your urine. If the amount of blood in your urine increases, call your health care provider. °· Follow instructions from your health care provider about eating or drinking restrictions. °· If a tissue sample was removed for testing (biopsy) during your procedure, it is your responsibility to get your test results. Ask your health care provider or the department performing the test when your results will be ready. °· Drink enough fluid to keep your urine clear or pale yellow. °· Keep all follow-up visits as told by your health care provider. This is important. °Contact a health care provider if: °· You have pain that  gets worse or does not get better with medicine, especially pain when you urinate. °· You have difficulty urinating. °Get help right away if: °· You have more blood in your urine. °· You have blood clots in your urine. °· You have abdominal pain. °· You have a fever or chills. °· You are unable to urinate. °This information is not intended to replace advice given to you by your health care provider. Make sure you discuss any questions you have with your health care provider. °Document Released: 01/01/2005 Document Revised: 11/20/2015 Document Reviewed: 05/01/2015 °Elsevier Interactive Patient Education © 2018 [employer] ° ° ° °Post Anesthesia Home Care Instructions ° °Activity: °Get plenty of rest for the remainder of the day. A responsible individual must stay with you for 24 hours following the procedure.  °For the next 24 hours, DO NOT: °-Drive a car °-Operate machinery °-Drink alcoholic beverages °-Take any medication unless instructed by your physician °-Make any legal decisions or sign important papers. ° °Meals: °Start with liquid foods such as gelatin or soup. Progress to regular foods as tolerated. Avoid greasy, spicy, heavy foods. If nausea and/or vomiting occur, drink only clear liquids until the nausea and/or vomiting subsides. Call your physician if vomiting continues. ° °Special Instructions/Symptoms: °Your throat may feel dry or sore from the anesthesia or the breathing tube placed in your throat during surgery. If this causes discomfort, gargle with warm salt water. The discomfort should disappear within 24 hours. ° °If you had a scopolamine patch placed behind your ear for the management   of post- operative nausea and/or vomiting: ° °1. The medication in the patch is effective for 72 hours, after which it should be removed.  Wrap patch in a tissue and discard in the trash. Wash hands thoroughly with soap and water. °2. You may remove the patch earlier than 72 hours if you experience unpleasant  side effects which may include dry mouth, dizziness or visual disturbances. °3. Avoid touching the patch. Wash your hands with soap and water after contact with the patch. °  ° °

## 2018-03-10 NOTE — Op Note (Signed)
Preoperative diagnosis: Bladder neoplasm Postoperative diagnosis: Same  Procedure: Bilateral retrograde pyelogram, TURBT 2 to 5 cm  Surgeon: Junious Silk  Anesthesia: General  Indication for procedure: 62 year old with history of bladder cancer with recurrent papillary tumors.  He was brought to OR for resection.  Findings: On cystoscopy the fossa navicularis was tight but the scope and resectoscope were able to pass without formal dilation.  Prostatic urethra unremarkable.  Trigone and ureteral orifice ease appeared normal apart from some very early papillary tumor along the right trigone.  In the bladder there was a scattering of low-grade superficial appearing tumors along the inferior posterior bladder on the right and left, a collection of 3 tumors posteriorly and superiorly and a collection of 5 tumors at the dome.  There were 3-5 other areas of very early papillary formations that simply scraped off.  These were ablated and fulgurated.  Left retrograde pyelogram-this outlined the delicate ureter and collecting system without filling defect, stricture or dilation.  Right retrograde pyelogram- this outlined the delicate ureter and collecting system without filling defect, stricture or dilation.  On exam under anesthesia the penis was circumcised without mass or lesion.  Testicles were palpably normal bilaterally.  On DRE the prostate was about 25 g and palpably normal without hard area or nodule.  Description of procedure: After consent was obtained patient brought to the operating room.  After adequate anesthesia he was placed in lithotomy position prepped and draped in usual sterile fashion.  A timeout was performed to confirm the patient and procedure.  Cystoscope was passed per urethra and I carefully inspected the bladder with a 30 degree and 70 degree lens.  I then took the loop and resected the posterior inferior tumors right and left and sent these together.  I then resected the posterior  superior tumors which were more on the left side a cluster of about 3 tumors.  I then resected the 5 tumors at the dome and fulgurated all of these tumors which together was a 3 to 4 cm area.  I was pleased with the resection.  I did not get muscle but the tumors were clinically and the mucosa which peeled off the muscle rather easily.  Hemostasis was excellent and although there were several places of resection and fulguration he should need a catheter postop.  Hemostasis was excellent at low pressure and the bladder was filled and the scope removed.  A 16 French Foley catheter was left to gravity drainage with clear urine.  He was awakened and taken to the recovery room in stable condition.  Postoperative instillation of gemcitabine in the PACU: Gemcitabine 2000 mg was instilled in the bladder and left indwelling for 50 minutes and then drained.  Complications: None  Blood loss: Minimal  Specimens to pathology: #1 posterior inferior tumors #2 posterior superior tumors #3 dome tumors  Drains: 16 French Foley for wake up-we will plan to DC  Disposition: Patient stable to PACU

## 2018-03-10 NOTE — Progress Notes (Signed)
Pt instructed to take Ibuprofen, and or tylenol as needed for pain at home.  Patient in agreement.    Dr. Junious Silk aware of pain levels and probable spasm prior to urinating when pain level was 6  Pt .Informed that if has spasms to call the office, patient is okay with this.

## 2018-03-10 NOTE — Anesthesia Preprocedure Evaluation (Signed)
Anesthesia Evaluation  Patient identified by MRN, date of birth, ID band Patient awake    Reviewed: Allergy & Precautions, H&P , NPO status , Patient's Chart, lab work & pertinent test results  Airway Mallampati: II   Neck ROM: full    Dental   Pulmonary former smoker,    breath sounds clear to auscultation       Cardiovascular negative cardio ROS   Rhythm:regular Rate:Normal     Neuro/Psych  Headaches,    GI/Hepatic GERD  ,  Endo/Other    Renal/GU    Bladder CA    Musculoskeletal  (+) Arthritis ,   Abdominal   Peds  Hematology   Anesthesia Other Findings   Reproductive/Obstetrics                             Anesthesia Physical Anesthesia Plan  ASA: II  Anesthesia Plan: General   Post-op Pain Management:    Induction: Intravenous  PONV Risk Score and Plan: 2 and Ondansetron, Dexamethasone, Midazolam and Treatment may vary due to age or medical condition  Airway Management Planned: LMA  Additional Equipment:   Intra-op Plan:   Post-operative Plan:   Informed Consent: I have reviewed the patients History and Physical, chart, labs and discussed the procedure including the risks, benefits and alternatives for the proposed anesthesia with the patient or authorized representative who has indicated his/her understanding and acceptance.     Plan Discussed with: CRNA, Anesthesiologist and Surgeon  Anesthesia Plan Comments:         Anesthesia Quick Evaluation

## 2018-03-10 NOTE — Anesthesia Procedure Notes (Signed)
Procedure Name: LMA Insertion Date/Time: 03/10/2018 11:23 AM Performed by: Bonney Aid, CRNA Pre-anesthesia Checklist: Patient identified, Emergency Drugs available, Suction available and Patient being monitored Patient Re-evaluated:Patient Re-evaluated prior to induction Oxygen Delivery Method: Circle system utilized Preoxygenation: Pre-oxygenation with 100% oxygen Induction Type: IV induction Ventilation: Mask ventilation without difficulty LMA: LMA inserted LMA Size: 5.0 Number of attempts: 1 Airway Equipment and Method: Bite block Placement Confirmation: positive ETCO2 Tube secured with: Tape Dental Injury: Teeth and Oropharynx as per pre-operative assessment

## 2018-03-10 NOTE — Interval H&P Note (Signed)
History and Physical Interval Note:  03/10/2018 11:14 AM  Ronald Atkins  has presented today for surgery, with the diagnosis of  bladder neoplasm  The various methods of treatment have been discussed with the patient and family. After consideration of risks, benefits and other options for treatment, the patient has consented to  Procedure(s): TRANSURETHRAL RESECTION OF BLADDER TUMOR (TURBT)/ CYSTOSCOPY/ POST OPERATIVE INSTILLATION OF CHEMOTHERAPY (N/A) as a surgical intervention. I also discussed with him the nature r/b/a to retrograde pyelograms.  The patient's history has been reviewed, patient examined, no change in status, stable for surgery.  I have reviewed the patient's chart and labs.  Questions were answered to the patient's satisfaction.  He elects to proceed. Discussed he may need foley post-op.    [name]

## 2018-03-13 ENCOUNTER — Encounter (HOSPITAL_BASED_OUTPATIENT_CLINIC_OR_DEPARTMENT_OTHER): Payer: Self-pay | Admitting: Urology

## 2018-03-13 NOTE — Anesthesia Postprocedure Evaluation (Signed)
Anesthesia Post Note  Patient: Ronald Atkins  Procedure(Atkins) Performed: TRANSURETHRAL RESECTION OF BLADDER TUMOR (TURBT)/ CYSTOSCOPY/WITH BILATERAL RETROGRADES, POST OPERATIVE INSTILLATION OF CHEMOTHERAPY IN PACU (N/A Bladder)     Patient location during evaluation: PACU Anesthesia Type: General Level of consciousness: awake and alert Pain management: pain level controlled Vital Signs Assessment: post-procedure vital signs reviewed and stable Respiratory status: spontaneous breathing, nonlabored ventilation, respiratory function stable and patient connected to nasal cannula oxygen Cardiovascular status: blood pressure returned to baseline and stable Postop Assessment: no apparent nausea or vomiting Anesthetic complications: no    Last Vitals:  Vitals:   03/10/18 1330 03/10/18 1525  BP: 133/78 (!) 138/91  Pulse: 71 66  Resp: 13 16  Temp:  36.7 C  SpO2: 93% 99%    Last Pain:  Vitals:   03/10/18 1540  TempSrc:   PainSc: 0-No pain                 Ronald Atkins

## 2018-08-09 ENCOUNTER — Other Ambulatory Visit: Payer: Self-pay | Admitting: Urology

## 2018-10-24 ENCOUNTER — Encounter (HOSPITAL_COMMUNITY): Payer: Self-pay

## 2018-10-24 NOTE — Patient Instructions (Addendum)
Ronald Atkins  10/24/2018       Your procedure is scheduled on:   10-31-2018   Report to [facility] Main  Entrance,  Report to admitting at  8:30 AM    Call this number if you have problems the morning of surgery [phone]      Remember: Do not eat food or drink liquids :After Midnight.  This includes water, candy, gum, or mints.  BRUSH YOUR TEETH MORNING OF SURGERY AND RINSE YOUR MOUTH OUT       Take these medicines the morning of surgery with A SIP OF WATER:   NONE                                  You may not have any metal on your body including piercings              Do not wear jewelry,  lotions, powders or perfumes, deodorant                          Men may shave face and neck.      Do not bring valuables to the hospital. [place].  Contacts, dentures or bridgework may not be worn into surgery.      Patients discharged the day of surgery will not be allowed to drive home. IF YOU ARE HAVING SURGERY AND GOING HOME THE SAME DAY, YOU MUST HAVE AN ADULT TO DRIVE YOU HOME AND BE WITH YOU FOR 24 HOURS. YOU MAY GO HOME BY TAXI OR UBER OR ORTHERWISE, BUT AN ADULT MUST ACCOMPANY YOU HOME AND STAY WITH YOU FOR 24 HOURS.   Name and phone number of your driver  Lemar Livings [phone]         _____________________________________________________________________             [facility] - Preparing for Surgery Before surgery, you can play an important role.  Because skin is not sterile, your skin needs to be as free of germs as possible.  You can reduce the number of germs on your skin by washing with CHG (chlorahexidine gluconate) soap before surgery.  CHG is an antiseptic cleaner which kills germs and bonds with the skin to continue killing germs even after washing. Please DO NOT use if you have an allergy to CHG or antibacterial soaps.  If your skin becomes reddened/irritated stop  using the CHG and inform your nurse when you arrive at Short Stay. Do not shave (including legs and underarms) for at least 48 hours prior to the first CHG shower.  You may shave your face/neck. Please follow these instructions carefully:  1.  Shower with CHG Soap the night before surgery and the  morning of Surgery.  2.  If you choose to wash your hair, wash your hair first as usual with your  normal  shampoo.  3.  After you shampoo, rinse your hair and body thoroughly to remove the  shampoo.                            4.  Use CHG as you would any other liquid soap.  You  can apply chg directly  to the skin and wash                       Gently with a scrungie or clean washcloth.  5.  Apply the CHG Soap to your body ONLY FROM THE NECK DOWN.   Do not use on face/ open                           Wound or open sores. Avoid contact with eyes, ears mouth and genitals (private parts).                       Wash face,  Genitals (private parts) with your normal soap.             6.  Wash thoroughly, paying special attention to the area where your surgery  will be performed.  7.  Thoroughly rinse your body with warm water from the neck down.  8.  DO NOT shower/wash with your normal soap after using and rinsing off  the CHG Soap.             9.  Pat yourself dry with a clean towel.            10.  Wear clean pajamas.            11.  Place clean sheets on your bed the night of your first shower and do not  sleep with pets. Day of Surgery : Do not apply any lotions/deodorants the morning of surgery.  Please wear clean clothes to the hospital/surgery center.  FAILURE TO FOLLOW THESE INSTRUCTIONS MAY RESULT IN THE CANCELLATION OF YOUR SURGERY PATIENT SIGNATURE_________________________________  NURSE SIGNATURE__________________________________  ________________________________________________________________________

## 2018-10-27 ENCOUNTER — Encounter (HOSPITAL_COMMUNITY)
Admission: RE | Admit: 2018-10-27 | Discharge: 2018-10-27 | Disposition: A | Discharge status: Home / Self Care | Payer: BLUE CROSS/BLUE SHIELD | Source: Ambulatory Visit | Attending: Urology | Admitting: Urology

## 2018-10-27 ENCOUNTER — Encounter (HOSPITAL_COMMUNITY): Payer: Self-pay

## 2018-10-27 ENCOUNTER — Other Ambulatory Visit: Payer: Self-pay

## 2018-10-27 DIAGNOSIS — Z01812 Encounter for preprocedural laboratory examination: Secondary | ICD-10-CM | POA: Diagnosis present

## 2018-10-27 DIAGNOSIS — C679 Malignant neoplasm of bladder, unspecified: Secondary | ICD-10-CM | POA: Diagnosis not present

## 2018-10-27 HISTORY — DX: Personal history of other diseases of the nervous system and sense organs: Z86.69

## 2018-10-27 LAB — CBC
HCT: 46.9 % (ref 39.0–52.0)
Hemoglobin: 15.8 g/dL (ref 13.0–17.0)
MCH: 31 pg (ref 26.0–34.0)
MCHC: 33.7 g/dL (ref 30.0–36.0)
MCV: 92.1 fL (ref 80.0–100.0)
Platelets: 233 10*3/uL (ref 150–400)
RBC: 5.09 MIL/uL (ref 4.22–5.81)
RDW: 12.7 % (ref 11.5–15.5)
WBC: 8.5 10*3/uL (ref 4.0–10.5)
nRBC: 0 % (ref 0.0–0.2)
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
## 2018-10-31 NOTE — Anesthesia Preprocedure Evaluation (Addendum)
Anesthesia Evaluation  Patient identified by MRN, date of birth, ID band Patient awake    Reviewed: Allergy & Precautions, NPO status , Patient's Chart, lab work & pertinent test results  Airway Mallampati: II  TM Distance: >3 FB Neck ROM: Full    Dental no notable dental hx.    Pulmonary Current Smoker,    Pulmonary exam normal breath sounds clear to auscultation       Cardiovascular negative cardio ROS Normal cardiovascular exam Rhythm:Regular Rate:Normal     Neuro/Psych  Headaches, negative psych ROS   GI/Hepatic negative GI ROS, Neg liver ROS,   Endo/Other  negative endocrine ROS  Renal/GU negative Renal ROS     Musculoskeletal negative musculoskeletal ROS (+)   Abdominal (+) + obese,   Peds  Hematology negative hematology ROS (+)   Anesthesia Other Findings BLADDER NEOPLASM  Reproductive/Obstetrics                           Anesthesia Physical Anesthesia Plan  ASA: II  Anesthesia Plan: General   Post-op Pain Management:    Induction: Intravenous  PONV Risk Score and Plan: 2 and Ondansetron, Dexamethasone, Midazolam and Treatment may vary due to age or medical condition  Airway Management Planned: LMA  Additional Equipment:   Intra-op Plan:   Post-operative Plan: Extubation in OR  Informed Consent: I have reviewed the patients History and Physical, chart, labs and discussed the procedure including the risks, benefits and alternatives for the proposed anesthesia with the patient or authorized representative who has indicated his/her understanding and acceptance.     Dental advisory given  Plan Discussed with: CRNA  Anesthesia Plan Comments: (Reviewed PAT note 10/27/18, Konrad Felix, PA-C)       Anesthesia Quick Evaluation

## 2018-11-02 NOTE — Progress Notes (Signed)
RN called patient to ask pre surgical Covid screening.  Left voicemail to return call.

## 2018-11-03 ENCOUNTER — Encounter (HOSPITAL_COMMUNITY)
Admission: RE | Disposition: A | Discharge status: Home / Self Care | Payer: Self-pay | Source: Home / Self Care | Attending: Urology

## 2018-11-03 ENCOUNTER — Ambulatory Visit (HOSPITAL_COMMUNITY): Payer: BLUE CROSS/BLUE SHIELD | Admitting: Physician Assistant

## 2018-11-03 ENCOUNTER — Encounter (HOSPITAL_COMMUNITY): Payer: Self-pay | Admitting: General Practice

## 2018-11-03 ENCOUNTER — Ambulatory Visit (HOSPITAL_COMMUNITY)
Admission: RE | Admit: 2018-11-03 | Discharge: 2018-11-03 | Disposition: A | Discharge status: Home / Self Care | Payer: BLUE CROSS/BLUE SHIELD | Attending: Urology | Admitting: Urology

## 2018-11-03 DIAGNOSIS — C679 Malignant neoplasm of bladder, unspecified: Secondary | ICD-10-CM | POA: Diagnosis not present

## 2018-11-03 DIAGNOSIS — F17210 Nicotine dependence, cigarettes, uncomplicated: Secondary | ICD-10-CM | POA: Diagnosis not present

## 2018-11-03 DIAGNOSIS — Z885 Allergy status to narcotic agent status: Secondary | ICD-10-CM | POA: Insufficient documentation

## 2018-11-03 DIAGNOSIS — M1388 Other specified arthritis, other site: Secondary | ICD-10-CM | POA: Diagnosis not present

## 2018-11-03 DIAGNOSIS — D414 Neoplasm of uncertain behavior of bladder: Secondary | ICD-10-CM

## 2018-11-03 DIAGNOSIS — D494 Neoplasm of unspecified behavior of bladder: Secondary | ICD-10-CM | POA: Diagnosis present

## 2018-11-03 HISTORY — PX: CYSTOSCOPY WITH FULGERATION: SHX6638

## 2018-11-03 SURGERY — CYSTOSCOPY, WITH BLADDER FULGURATION
Anesthesia: General

## 2018-11-03 MED ORDER — LACTATED RINGERS IV SOLN
INTRAVENOUS | Status: DC
  Administered 2018-11-03: 09:00:00 via INTRAVENOUS

## 2018-11-03 MED ORDER — ACETAMINOPHEN 500 MG PO TABS
1000.0000 mg | ORAL_TABLET | Freq: Once | ORAL | Status: AC
  Administered 2018-11-03: 1000 mg via ORAL
  Filled 2018-11-03: qty 2

## 2018-11-03 MED ORDER — DEXAMETHASONE SODIUM PHOSPHATE 10 MG/ML IJ SOLN
INTRAMUSCULAR | Status: AC
  Filled 2018-11-03: qty 1

## 2018-11-03 MED ORDER — SODIUM CHLORIDE 0.9 % IR SOLN
Status: DC | PRN
  Administered 2018-11-03 (×2): 3000 mL via INTRAVESICAL

## 2018-11-03 MED ORDER — FENTANYL CITRATE (PF) 100 MCG/2ML IJ SOLN
INTRAMUSCULAR | Status: DC | PRN
  Administered 2018-11-03 (×4): 50 ug via INTRAVENOUS

## 2018-11-03 MED ORDER — PROMETHAZINE HCL 25 MG/ML IJ SOLN: 6.2500 mg | INTRAMUSCULAR | Status: DC | PRN

## 2018-11-03 MED ORDER — ONDANSETRON HCL 4 MG/2ML IJ SOLN
INTRAMUSCULAR | Status: DC | PRN
  Administered 2018-11-03: 4 mg via INTRAVENOUS

## 2018-11-03 MED ORDER — HYDROMORPHONE HCL 1 MG/ML IJ SOLN: 0.2500 mg | INTRAMUSCULAR | Status: DC | PRN

## 2018-11-03 MED ORDER — CEFAZOLIN SODIUM-DEXTROSE 2-4 GM/100ML-% IV SOLN
2.0000 g | Freq: Once | INTRAVENOUS | Status: AC
  Administered 2018-11-03: 2 g via INTRAVENOUS
  Filled 2018-11-03: qty 100

## 2018-11-03 MED ORDER — OXYCODONE HCL 5 MG PO TABS: 5.0000 mg | ORAL_TABLET | Freq: Once | ORAL | Status: DC | PRN

## 2018-11-03 MED ORDER — FENTANYL CITRATE (PF) 100 MCG/2ML IJ SOLN
INTRAMUSCULAR | Status: AC
  Filled 2018-11-03: qty 2

## 2018-11-03 MED ORDER — PROPOFOL 10 MG/ML IV BOLUS
INTRAVENOUS | Status: AC
  Filled 2018-11-03: qty 20

## 2018-11-03 MED ORDER — OXYCODONE HCL 5 MG/5ML PO SOLN: 5.0000 mg | Freq: Once | ORAL | Status: DC | PRN

## 2018-11-03 MED ORDER — LIDOCAINE 2% (20 MG/ML) 5 ML SYRINGE
INTRAMUSCULAR | Status: AC
  Filled 2018-11-03: qty 5

## 2018-11-03 MED ORDER — STERILE WATER FOR IRRIGATION IR SOLN
Status: DC | PRN
  Administered 2018-11-03: 3000 mL

## 2018-11-03 MED ORDER — KETOROLAC TROMETHAMINE 30 MG/ML IJ SOLN: 30.0000 mg | Freq: Once | INTRAMUSCULAR | Status: DC | PRN

## 2018-11-03 MED ORDER — PROPOFOL 10 MG/ML IV BOLUS
INTRAVENOUS | Status: DC | PRN
  Administered 2018-11-03: 150 mg via INTRAVENOUS

## 2018-11-03 MED ORDER — DEXAMETHASONE SODIUM PHOSPHATE 10 MG/ML IJ SOLN
INTRAMUSCULAR | Status: DC | PRN
  Administered 2018-11-03: 10 mg via INTRAVENOUS

## 2018-11-03 MED ORDER — EPHEDRINE SULFATE-NACL 50-0.9 MG/10ML-% IV SOSY
PREFILLED_SYRINGE | INTRAVENOUS | Status: DC | PRN
  Administered 2018-11-03 (×2): 10 mg via INTRAVENOUS

## 2018-11-03 MED ORDER — ONDANSETRON HCL 4 MG/2ML IJ SOLN
INTRAMUSCULAR | Status: AC
  Filled 2018-11-03: qty 2

## 2018-11-03 MED ORDER — GEMCITABINE CHEMO FOR BLADDER INSTILLATION 2000 MG
2000.0000 mg | Freq: Once | INTRAVENOUS | Status: AC
  Administered 2018-11-03: 12:00:00 2000 mg via INTRAVESICAL
  Filled 2018-11-03: qty 2000

## 2018-11-03 MED ORDER — LIDOCAINE 2% (20 MG/ML) 5 ML SYRINGE
INTRAMUSCULAR | Status: DC | PRN
  Administered 2018-11-03: 80 mg via INTRAVENOUS

## 2018-11-03 SURGICAL SUPPLY — 14 items
BAG URINE DRAINAGE (UROLOGICAL SUPPLIES) ×2 IMPLANT
BAG URO CATCHER STRL LF (MISCELLANEOUS) ×2 IMPLANT
CATH FOLEY 2WAY SLVR  5CC 18FR (CATHETERS) ×1
CATH FOLEY 2WAY SLVR 5CC 18FR (CATHETERS) ×1 IMPLANT
COVER WAND RF STERILE (DRAPES) IMPLANT
GLOVE BIOGEL M STRL SZ7.5 (GLOVE) ×2 IMPLANT
GOWN STRL REUS W/TWL XL LVL3 (GOWN DISPOSABLE) ×2 IMPLANT
KIT TURNOVER KIT A (KITS) IMPLANT
LOOP CUT BIPOLAR 24F LRG (ELECTROSURGICAL) ×2 IMPLANT
MANIFOLD NEPTUNE II (INSTRUMENTS) ×2 IMPLANT
PACK CYSTO (CUSTOM PROCEDURE TRAY) ×2 IMPLANT
PLUG CATH AND CAP STER (CATHETERS) ×2 IMPLANT
TUBING CONNECTING 10 (TUBING) ×2 IMPLANT
TUBING UROLOGY SET (TUBING) ×2 IMPLANT

## 2018-11-03 NOTE — Anesthesia Postprocedure Evaluation (Signed)
Anesthesia Post Note  Patient: Ronald Atkins  Procedure(s) Performed: CYSTOSCOPY WITH FULGERATION/ BLADDER BIOPSY/INSTILLATION OF GEMCITABINE (N/A )     Patient location during evaluation: PACU Anesthesia Type: General Level of consciousness: awake and alert Pain management: pain level controlled Vital Signs Assessment: post-procedure vital signs reviewed and stable Respiratory status: spontaneous breathing, nonlabored ventilation, respiratory function stable and patient connected to nasal cannula oxygen Cardiovascular status: blood pressure returned to baseline and stable Postop Assessment: no apparent nausea or vomiting Anesthetic complications: no    Last Vitals:  Vitals:   11/03/18 1230 11/03/18 1245  BP: (!) 151/101 129/88  Pulse: 73 74  Resp: 14 17  Temp:  36.5 C  SpO2: 95% 98%    Last Pain:  Vitals:   11/03/18 1245  TempSrc:   PainSc: 0-No pain                 Delontae Lamm P Mozetta Murfin

## 2018-11-03 NOTE — Anesthesia Procedure Notes (Signed)
Procedure Name: LMA Insertion Date/Time: 11/03/2018 10:05 AM Performed by: Mitzie Na, CRNA Pre-anesthesia Checklist: Patient identified, Emergency Drugs available, Suction available, Patient being monitored and Timeout performed Patient Re-evaluated:Patient Re-evaluated prior to induction Oxygen Delivery Method: Circle system utilized Preoxygenation: Pre-oxygenation with 100% oxygen Induction Type: IV induction LMA: LMA inserted LMA Size: 4.0 Number of attempts: 1 Placement Confirmation: positive ETCO2 and breath sounds checked- equal and bilateral Tube secured with: Tape Dental Injury: Teeth and Oropharynx as per pre-operative assessment

## 2018-11-03 NOTE — Progress Notes (Signed)
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  no

## 2018-11-03 NOTE — Discharge Instructions (Signed)
Cystoscopy    Cystoscopy is a procedure that is used to help diagnose and sometimes treat conditions that affect that lower urinary tract. The lower urinary tract includes the bladder and the tube that drains urine from the bladder out of the body (urethra). Cystoscopy is performed with a thin, tube-shaped instrument with a light and camera at the end (cystoscope). The cystoscope may be hard (rigid) or flexible, depending on the goal of the procedure.The cystoscope is inserted through the urethra, into the bladder.  Cystoscopy may be recommended if you have:   Urinary tractinfections that keep coming back (recurring).   Blood in the urine (hematuria).   Loss of bladder control (urinary incontinence) or an overactive bladder.   Unusual cells found in a urine sample.   A blockage in the urethra.   Painful urination.   An abnormality in the bladder found during an intravenous pyelogram (IVP) or CT scan.  Cystoscopy may also be done to remove a sample of tissue to be examined under a microscope (biopsy).  Tell a health care provider about:   Any allergies you have.   All medicines you are taking, including vitamins, herbs, eye drops, creams, and over-the-counter medicines.   Any problems you or family members have had with anesthetic medicines.   Any blood disorders you have.   Any surgeries you have had.   Any medical conditions you have.   Whether you are pregnant or may be pregnant.  What are the risks?  Generally, this is a safe procedure. However, problems may occur, including:   Infection.   Bleeding.   Allergic reactions to medicines.   Damage to other structures or organs.  What happens before the procedure?   Ask your health care provider about:  ? Changing or stopping your regular medicines. This is especially important if you are taking diabetes medicines or blood thinners.  ? Taking medicines such as aspirin and ibuprofen. These medicines can thin your blood. Do not take these medicines  before your procedure if your health care provider instructs you not to.   Follow instructions from your health care provider about eating or drinking restrictions.   You may be given antibiotic medicine to help prevent infection.   You may have an exam or testing, such as X-rays of the bladder, urethra, or kidneys.   You may have urine tests to check for signs of infection.   Plan to have someone take you home after the procedure.  What happens during the procedure?   To reduce your risk of infection,your health care team will wash or sanitize their hands.   You will be given one or more of the following:  ? A medicine to help you relax (sedative).  ? A medicine to numb the area (local anesthetic).   The area around the opening of your urethra will be cleaned.   The cystoscope will be passed through your urethra into your bladder.   Germ-free (sterile)fluid will flow through the cystoscope to fill your bladder. The fluid will stretch your bladder so that your surgeon can clearly examine your bladder walls.   The cystoscope will be removed and your bladder will be emptied.  The procedure may vary among health care providers and hospitals.  What happens after the procedure?   You may have some soreness or pain in your abdomen and urethra. Medicines will be available to help you.   You may have some blood in your urine.   Do not drive for   24 hours if you received a sedative.  This information is not intended to replace advice given to you by your health care provider. Make sure you discuss any questions you have with your health care provider.  Document Released: 06/11/2000 Document Revised: 03/25/2017 Document Reviewed: 05/01/2015  Elsevier Interactive Patient Education  2019 [employer]

## 2018-11-03 NOTE — Transfer of Care (Signed)
Immediate Anesthesia Transfer of Care Note  Patient: Ronald Atkins  Procedure(s) Performed: CYSTOSCOPY WITH FULGERATION/ BLADDER BIOPSY/INSTILLATION OF GEMCITABINE (N/A )  Patient Location: PACU  Anesthesia Type:General  Level of Consciousness: awake, alert , oriented and patient cooperative  Airway & Oxygen Therapy: Patient Spontanous Breathing and Patient connected to face mask oxygen  Post-op Assessment: Report given to RN, Post -op Vital signs reviewed and stable and Patient moving all extremities  Post vital signs: Reviewed and stable  Last Vitals:  Vitals Value Taken Time  BP    Temp    Pulse 81 11/03/2018 11:12 AM  Resp 14 11/03/2018 11:12 AM  SpO2 98 % 11/03/2018 11:12 AM  Vitals shown include unvalidated device data.  Last Pain:  Vitals:   11/03/18 0832  TempSrc: Oral  PainSc: 4          Complications: No apparent anesthesia complications
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
## 2018-11-03 NOTE — Op Note (Signed)
Preoperative diagnosis bladder neoplasm Postoperative diagnosis: Same  Procedure: Cystoscopy, TURBT 2 to 5 cm, postoperative instillation of gemcitabine in PACU  Surgeon: Ronald Atkins  Anesthesia: General  Indication for procedure: Ronald Atkins is a 63 year old male with history low-grade TA bladder tumors.  A bladder tumor was noted on the right side of the bladder on recent office cystoscopy and he was brought today for TURBT.  Findings: On cystoscopy there was a right anterior bladder tumor.  It was tucked down a couple of centimeters up from the bladder neck and very difficult to get to.  Given his anatomy, using his accessory muscles to breathe and the location of the tumor it was very difficult to resect.  Finally with a combination of controlling the bladder volume, significant suprapubic pressure and fentanyl to slow his breathing rate and was able to resect the anterior tumor.  There were a couple of small areas of erythema posteriorly that I biopsied 1 of those and fulgurated the other.  Description of procedure: After consent was obtained patient brought to the operating room.  After adequate anesthesia was placed in lithotomy position and prepped and draped in the usual sterile fashion.  A timeout was performed to confirm the patient and procedure.  The cystoscope was passed per urethra and the bladder carefully inspected with a 30 degree and 70 degree lens.  I then used a cold cup biopsy forceps to biopsy posteriorly and then fulgurated a few areas of erythema or possibly early tumor.  Nothing significant here.  The main tumor was right anterior bladder neck and the cold cup forceps could not quite reach to the base even with the manipulations as noted above.  Therefore I switched to the loop.  I had to dilate the meatus and fossa navicularis to 28 [place] to pass to the 26 [place] resectoscope sheath with the visual obturator.  I then switched out to the handle and loop. Timing when he inhaled I  was able to resect the tumor and then fulgurate the base.  About 3 cm area was resected and fulgurated.  I could see some of the muscle at the edge of the resection but not certain any would be in the specimen.  The area was fulgurated and I inspected again for a bit longer with an empty and full bladder and there was no bleeding and no further tumor.  The other biopsy sites also had excellent hemostasis.  Complications: None  Blood loss: Minimal  Specimens to pathology: #1 posterior bladder biopsy-cold cup #2 right anterior bladder tumor- loop resection  Drains: 18 French Foley catheter which we will plan to remove  Disposition: Patient stable to PACU  Instillation of gemcitabine in PACU: In the PACU 2000 mg of gemcitabine was instilled per urethra into the bladder and left indwelling for 50 minutes.  The bladder was then drained, urine remained clear and Foley catheter was removed.

## 2018-11-04 ENCOUNTER — Encounter (HOSPITAL_COMMUNITY): Payer: Self-pay | Admitting: Urology

## 2019-03-09 ENCOUNTER — Other Ambulatory Visit: Payer: Self-pay | Admitting: Urology

## 2019-04-17 NOTE — Patient Instructions (Signed)
DUE TO COVID-19 ONLY ONE VISITOR IS ALLOWED TO COME WITH YOU AND STAY IN THE WAITING ROOM ONLY DURING PRE OP AND PROCEDURE. THE ONE VISITOR MAY VISIT WITH YOU IN YOUR PRIVATE ROOM DURING VISITING HOURS ONLY!!   COVID SWAB TESTING COMPLETED ON:   Today, Oct, 21, 2020.  (Must self quarantine after testing. Follow instructions on handout.)             Your procedure is scheduled on: Tuesday, Oct. 27, 2020   Report to [facility] Main  Entrance    Report to admitting at 9:45 AM   Call this number if you have problems the morning of surgery [phone]   Do not eat food or drink liquids :After Midnight.   Brush your teeth the morning of surgery.   Do NOT smoke after Midnight   Take these medicines the morning of surgery with A SIP OF WATER: None                                You may not have any metal on your body including jewelry, and body piercings             Do not wear lotions, powders, perfumes/cologne, or deodorant                          Men may shave face and neck.   Do not bring valuables to the hospital. [place].   Contacts, dentures or bridgework may not be worn into surgery.    Patients discharged the day of surgery will not be allowed to drive home.   Special Instructions: Bring a copy of your healthcare power of attorney and living will documents         the day of surgery if you haven't scanned them in before.              Please read over the following fact sheets you were given:  [facility] - Preparing for Surgery Before surgery, you can play an important role.  Because skin is not sterile, your skin needs to be as free of germs as possible.  You can reduce the number of germs on your skin by washing with CHG (chlorahexidine gluconate) soap before surgery.  CHG is an antiseptic cleaner which kills germs and bonds with the skin to continue killing germs even after washing. Please DO NOT use if you  have an allergy to CHG or antibacterial soaps.  If your skin becomes reddened/irritated stop using the CHG and inform your nurse when you arrive at Short Stay. Do not shave (including legs and underarms) for at least 48 hours prior to the first CHG shower.  You may shave your face/neck.  Please follow these instructions carefully:  1.  Shower with CHG Soap the night before surgery and the  morning of surgery.  2.  If you choose to wash your hair, wash your hair first as usual with your normal  shampoo.  3.  After you shampoo, rinse your hair and body thoroughly to remove the shampoo.                             4.  Use CHG as you would any other  liquid soap.  You can apply chg directly to the skin and wash.  Gently with a scrungie or clean washcloth.  5.  Apply the CHG Soap to your body ONLY FROM THE NECK DOWN.   Do   not use on face/ open                           Wound or open sores. Avoid contact with eyes, ears mouth and   genitals (private parts).                       Wash face,  Genitals (private parts) with your normal soap.             6.  Wash thoroughly, paying special attention to the area where your    surgery  will be performed.  7.  Thoroughly rinse your body with warm water from the neck down.  8.  DO NOT shower/wash with your normal soap after using and rinsing off the CHG Soap.                9.  Pat yourself dry with a clean towel.            10.  Wear clean pajamas.            11.  Place clean sheets on your bed the night of your first shower and do not  sleep with pets. Day of Surgery : Do not apply any lotions/deodorants the morning of surgery.  Please wear clean clothes to the hospital/surgery center.  FAILURE TO FOLLOW THESE INSTRUCTIONS MAY RESULT IN THE CANCELLATION OF YOUR SURGERY  PATIENT SIGNATURE_________________________________  NURSE SIGNATURE__________________________________  ________________________________________________________________________

## 2019-04-20 ENCOUNTER — Other Ambulatory Visit: Payer: Self-pay

## 2019-04-20 ENCOUNTER — Other Ambulatory Visit (HOSPITAL_COMMUNITY)
Admission: RE | Admit: 2019-04-20 | Discharge: 2019-04-20 | Disposition: A | Discharge status: Home / Self Care | Payer: BC Managed Care – PPO | Source: Ambulatory Visit

## 2019-04-20 ENCOUNTER — Encounter (HOSPITAL_COMMUNITY): Payer: Self-pay

## 2019-04-20 ENCOUNTER — Encounter (HOSPITAL_COMMUNITY)
Admission: RE | Admit: 2019-04-20 | Discharge: 2019-04-20 | Disposition: A | Discharge status: Home / Self Care | Payer: BC Managed Care – PPO | Source: Ambulatory Visit | Attending: Urology | Admitting: Urology

## 2019-04-20 DIAGNOSIS — Z01812 Encounter for preprocedural laboratory examination: Secondary | ICD-10-CM | POA: Diagnosis not present

## 2019-04-20 DIAGNOSIS — D494 Neoplasm of unspecified behavior of bladder: Secondary | ICD-10-CM | POA: Diagnosis not present

## 2019-04-20 DIAGNOSIS — Z20828 Contact with and (suspected) exposure to other viral communicable diseases: Secondary | ICD-10-CM | POA: Diagnosis not present

## 2019-04-20 HISTORY — DX: Pneumonia, unspecified organism: J18.9

## 2019-04-20 HISTORY — DX: Other cervical disc degeneration, unspecified cervical region: M50.30

## 2019-04-20 HISTORY — DX: Tinnitus, unspecified ear: H93.19

## 2019-04-20 HISTORY — DX: Localized swelling, mass and lump, neck: R22.1

## 2019-04-20 LAB — CBC
HCT: 46.8 % (ref 39.0–52.0)
Hemoglobin: 15.7 g/dL (ref 13.0–17.0)
MCH: 30.1 pg (ref 26.0–34.0)
MCHC: 33.5 g/dL (ref 30.0–36.0)
MCV: 89.8 fL (ref 80.0–100.0)
Platelets: 257 10*3/uL (ref 150–400)
RBC: 5.21 MIL/uL (ref 4.22–5.81)
RDW: 12.1 % (ref 11.5–15.5)
WBC: 10.5 10*3/uL (ref 4.0–10.5)
nRBC: 0 % (ref 0.0–0.2)

## 2019-04-20 NOTE — Progress Notes (Signed)
SPOKE W/  Chrissie Noa     SCREENING SYMPTOMS OF COVID 19:   COUGH--NO  RUNNY NOSE--- NO  SORE THROAT---NO  NASAL CONGESTION----NO  SNEEZING----NO  SHORTNESS OF BREATH---NO  DIFFICULTY BREATHING---NO  TEMP >100.0 -----NO  UNEXPLAINED BODY ACHES------NO  CHILLS -------- NO  HEADACHES ---------NO  LOSS OF SMELL/ TASTE --------NO    HAVE YOU OR ANY FAMILY MEMBER TRAVELLED PAST 14 DAYS OUT OF THE   COUNTY---Travelled [place]----Travelled [place] COUNTRY----NO  HAVE YOU OR ANY FAMILY MEMBER BEEN EXPOSED TO ANYONE WITH COVID 19? NO

## 2019-04-20 NOTE — Progress Notes (Signed)
PCP - Dr. Deirdre Evener Cardiologist - N/A  Chest x-ray - N/A EKG - N/A Stress Test - N/A ECHO - N/A Cardiac Cath - N/A  Sleep Study - N/A CPAP - N/A  Fasting Blood Sugar -N/A Checks Blood Sugar _N/A____ times a day  Blood Thinner Instructions:  N/A Aspirin Instructions:N/A Last Dose:  N/A  Anesthesia review: N/A  Patient denies shortness of breath, fever, cough and chest pain at PAT appointment   Patient verbalized understanding of instructions that were given to them at the PAT appointment. Patient was also instructed that they will need to review over the PAT instructions again at home before surgery.

## 2019-04-23 LAB — NOVEL CORONAVIRUS, NAA (HOSP ORDER, SEND-OUT TO REF LAB; TAT 18-24 HRS): SARS-CoV-2, NAA: NOT DETECTED

## 2019-04-24 ENCOUNTER — Ambulatory Visit (HOSPITAL_COMMUNITY): Payer: BC Managed Care – PPO | Admitting: Certified Registered"

## 2019-04-24 ENCOUNTER — Ambulatory Visit (HOSPITAL_COMMUNITY): Payer: BC Managed Care – PPO

## 2019-04-24 ENCOUNTER — Encounter (HOSPITAL_COMMUNITY): Payer: Self-pay | Admitting: *Deleted

## 2019-04-24 ENCOUNTER — Ambulatory Visit (HOSPITAL_COMMUNITY)
Admission: RE | Admit: 2019-04-24 | Discharge: 2019-04-24 | Disposition: A | Discharge status: Home / Self Care | Payer: BC Managed Care – PPO | Attending: Urology | Admitting: Urology

## 2019-04-24 ENCOUNTER — Encounter (HOSPITAL_COMMUNITY)
Admission: RE | Disposition: A | Discharge status: Home / Self Care | Payer: Self-pay | Source: Home / Self Care | Attending: Urology

## 2019-04-24 DIAGNOSIS — C670 Malignant neoplasm of trigone of bladder: Secondary | ICD-10-CM | POA: Diagnosis not present

## 2019-04-24 DIAGNOSIS — M19041 Primary osteoarthritis, right hand: Secondary | ICD-10-CM | POA: Insufficient documentation

## 2019-04-24 DIAGNOSIS — F17210 Nicotine dependence, cigarettes, uncomplicated: Secondary | ICD-10-CM | POA: Insufficient documentation

## 2019-04-24 DIAGNOSIS — M19042 Primary osteoarthritis, left hand: Secondary | ICD-10-CM | POA: Insufficient documentation

## 2019-04-24 DIAGNOSIS — C678 Malignant neoplasm of overlapping sites of bladder: Secondary | ICD-10-CM

## 2019-04-24 DIAGNOSIS — Z7982 Long term (current) use of aspirin: Secondary | ICD-10-CM | POA: Diagnosis not present

## 2019-04-24 DIAGNOSIS — C679 Malignant neoplasm of bladder, unspecified: Secondary | ICD-10-CM | POA: Diagnosis present

## 2019-04-24 HISTORY — PX: CYSTOSCOPY WITH BIOPSY: SHX5122

## 2019-04-24 SURGERY — CYSTOSCOPY, WITH BIOPSY
Anesthesia: General

## 2019-04-24 MED ORDER — DEXAMETHASONE SODIUM PHOSPHATE 10 MG/ML IJ SOLN
INTRAMUSCULAR | Status: DC | PRN
  Administered 2019-04-24: 10 mg via INTRAVENOUS

## 2019-04-24 MED ORDER — LACTATED RINGERS IV SOLN
INTRAVENOUS | Status: DC
  Administered 2019-04-24 (×2): via INTRAVENOUS

## 2019-04-24 MED ORDER — SODIUM CHLORIDE (PF) 0.9 % IJ SOLN
50.0000 mg | Freq: Once | INTRAVENOUS | Status: DC
  Filled 2019-04-24: qty 25

## 2019-04-24 MED ORDER — PROMETHAZINE HCL 25 MG/ML IJ SOLN: 6.2500 mg | INTRAMUSCULAR | Status: DC | PRN

## 2019-04-24 MED ORDER — KETOROLAC TROMETHAMINE 30 MG/ML IJ SOLN: 30.0000 mg | Freq: Once | INTRAMUSCULAR | Status: AC | PRN

## 2019-04-24 MED ORDER — FENTANYL CITRATE (PF) 100 MCG/2ML IJ SOLN: 25.0000 ug | INTRAMUSCULAR | Status: DC | PRN

## 2019-04-24 MED ORDER — 0.9 % SODIUM CHLORIDE (POUR BTL) OPTIME
TOPICAL | Status: DC | PRN
  Administered 2019-04-24: 1000 mL

## 2019-04-24 MED ORDER — LIDOCAINE 2% (20 MG/ML) 5 ML SYRINGE
INTRAMUSCULAR | Status: DC | PRN
  Administered 2019-04-24: 40 mg via INTRAVENOUS

## 2019-04-24 MED ORDER — STERILE WATER FOR IRRIGATION IR SOLN
Status: DC | PRN
  Administered 2019-04-24: 6000 mL

## 2019-04-24 MED ORDER — ACETAMINOPHEN 500 MG PO TABS
1000.0000 mg | ORAL_TABLET | Freq: Once | ORAL | Status: AC
  Administered 2019-04-24: 1000 mg via ORAL
  Filled 2019-04-24: qty 2

## 2019-04-24 MED ORDER — PHENYLEPHRINE 40 MCG/ML (10ML) SYRINGE FOR IV PUSH (FOR BLOOD PRESSURE SUPPORT)
PREFILLED_SYRINGE | INTRAVENOUS | Status: DC | PRN
  Administered 2019-04-24: 120 ug via INTRAVENOUS
  Administered 2019-04-24: 160 ug via INTRAVENOUS
  Administered 2019-04-24 (×2): 80 ug via INTRAVENOUS
  Administered 2019-04-24: 120 ug via INTRAVENOUS

## 2019-04-24 MED ORDER — MIDAZOLAM HCL 2 MG/2ML IJ SOLN
INTRAMUSCULAR | Status: AC
  Filled 2019-04-24: qty 2

## 2019-04-24 MED ORDER — FENTANYL CITRATE (PF) 100 MCG/2ML IJ SOLN
INTRAMUSCULAR | Status: DC | PRN
  Administered 2019-04-24 (×2): 50 ug via INTRAVENOUS

## 2019-04-24 MED ORDER — CEFAZOLIN SODIUM-DEXTROSE 2-4 GM/100ML-% IV SOLN
2.0000 g | INTRAVENOUS | Status: AC
  Administered 2019-04-24: 2 g via INTRAVENOUS
  Filled 2019-04-24: qty 100

## 2019-04-24 MED ORDER — MEPERIDINE HCL 50 MG/ML IJ SOLN: 6.2500 mg | INTRAMUSCULAR | Status: DC | PRN

## 2019-04-24 MED ORDER — ONDANSETRON HCL 4 MG/2ML IJ SOLN
INTRAMUSCULAR | Status: DC | PRN
  Administered 2019-04-24: 4 mg via INTRAVENOUS

## 2019-04-24 MED ORDER — MIDAZOLAM HCL 5 MG/5ML IJ SOLN
INTRAMUSCULAR | Status: DC | PRN
  Administered 2019-04-24: 2 mg via INTRAVENOUS

## 2019-04-24 MED ORDER — SODIUM CHLORIDE (PF) 0.9 % IJ SOLN: 80.0000 mg | Freq: Once | INTRAVENOUS | Status: DC

## 2019-04-24 MED ORDER — SUGAMMADEX SODIUM 500 MG/5ML IV SOLN
INTRAVENOUS | Status: AC
  Filled 2019-04-24: qty 5

## 2019-04-24 MED ORDER — SODIUM CHLORIDE (PF) 0.9 % IJ SOLN
80.0000 mg | Freq: Once | INTRAVENOUS | Status: AC
  Administered 2019-04-24: 80 mg via INTRAVESICAL
  Filled 2019-04-24: qty 40

## 2019-04-24 MED ORDER — PROPOFOL 10 MG/ML IV BOLUS
INTRAVENOUS | Status: DC | PRN
  Administered 2019-04-24: 200 mg via INTRAVENOUS

## 2019-04-24 MED ORDER — FENTANYL CITRATE (PF) 100 MCG/2ML IJ SOLN
INTRAMUSCULAR | Status: AC
  Filled 2019-04-24: qty 2

## 2019-04-24 MED ORDER — IOHEXOL 300 MG/ML  SOLN
INTRAMUSCULAR | Status: DC | PRN
  Administered 2019-04-24: 13 mL via URETHRAL

## 2019-04-24 MED ORDER — EPHEDRINE SULFATE-NACL 50-0.9 MG/10ML-% IV SOSY
PREFILLED_SYRINGE | INTRAVENOUS | Status: DC | PRN
  Administered 2019-04-24: 5 mg via INTRAVENOUS

## 2019-04-24 SURGICAL SUPPLY — 20 items
BAG URINE DRAINAGE (UROLOGICAL SUPPLIES) IMPLANT
BAG URO CATCHER STRL LF (MISCELLANEOUS) ×3 IMPLANT
CATH FOLEY 2WAY SLVR  5CC 16FR (CATHETERS) ×2
CATH FOLEY 2WAY SLVR 5CC 16FR (CATHETERS) IMPLANT
CATH INTERMIT  6FR 70CM (CATHETERS) ×2 IMPLANT
ELECT REM PT RETURN 15FT ADLT (MISCELLANEOUS) ×2 IMPLANT
GLOVE BIOGEL M STRL SZ7.5 (GLOVE) ×3 IMPLANT
GOWN STRL REUS W/TWL XL LVL3 (GOWN DISPOSABLE) ×3 IMPLANT
KIT TURNOVER KIT A (KITS) ×2 IMPLANT
LOOP CUT BIPOLAR 24F LRG (ELECTROSURGICAL) IMPLANT
MANIFOLD NEPTUNE II (INSTRUMENTS) ×3 IMPLANT
NDL SAFETY ECLIPSE 18X1.5 (NEEDLE) IMPLANT
NEEDLE HYPO 18GX1.5 SHARP (NEEDLE) ×2
PACK CYSTO (CUSTOM PROCEDURE TRAY) ×3 IMPLANT
PAD TELFA 2X3 NADH STRL (GAUZE/BANDAGES/DRESSINGS) ×2 IMPLANT
SYR 10ML LL (SYRINGE) ×2 IMPLANT
TUBING CONNECTING 10 (TUBING) ×2 IMPLANT
TUBING CONNECTING 10' (TUBING) ×1
TUBING UROLOGY SET (TUBING) ×3 IMPLANT
WATER STERILE IRR 500ML POUR (IV SOLUTION) ×2 IMPLANT
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
## 2019-04-24 NOTE — Anesthesia Procedure Notes (Signed)
Procedure Name: LMA Insertion Date/Time: 04/24/2019 12:13 PM Performed by: Silas Sacramento, CRNA Pre-anesthesia Checklist: Patient identified, Emergency Drugs available, Suction available and Patient being monitored Patient Re-evaluated:Patient Re-evaluated prior to induction Oxygen Delivery Method: Circle system utilized Preoxygenation: Pre-oxygenation with 100% oxygen Induction Type: IV induction LMA: LMA inserted LMA Size: 5.0 Tube type: Oral Number of attempts: 1 Placement Confirmation: positive ETCO2 and breath sounds checked- equal and bilateral Tube secured with: Tape Dental Injury: Teeth and Oropharynx as per pre-operative assessment

## 2019-04-24 NOTE — Anesthesia Postprocedure Evaluation (Signed)
Anesthesia Post Note  Patient: Ronald Atkins  Procedure(s) Performed: CYSTOSCOPY WITH BLADDER BIOPSY 0.5-2CM, FULGURATION/ EPIRUBICIN INSTILLATION/ BILATERAL RETROGRADE PYELOGRAM (N/A )     Patient location during evaluation: PACU Anesthesia Type: General Level of consciousness: awake and alert, oriented and patient cooperative Pain management: pain level controlled Vital Signs Assessment: post-procedure vital signs reviewed and stable Respiratory status: spontaneous breathing, nonlabored ventilation and respiratory function stable Cardiovascular status: blood pressure returned to baseline and stable Postop Assessment: no apparent nausea or vomiting Anesthetic complications: no    Last Vitals:  Vitals:   04/24/19 1303 04/24/19 1315  BP: (!) 134/99 120/79  Pulse: 69 68  Resp: 15 16  Temp: (!) 36.4 C   SpO2: 100% 100%    Last Pain:  Vitals:   04/24/19 1303  TempSrc:   PainSc: 0-No pain                 Pervis Hocking

## 2019-04-24 NOTE — Op Note (Addendum)
Preoperative diagnosis: Urothelial cancer Postoperative diagnosis: Urothelial cancer  Procedure: Cystoscopy with bladder biopsy 0.5 to 2 cm, fulguration.  Bilateral retrograde pyelogram.  Postoperative instillation of epirubicin in PACU.  Surgeon: Junious Silk  Anesthesia: General  Indication for procedure: Ronald Atkins is a 63 year old male with recurrent low-grade TA bladder cancer who had a few recurrences and was brought today for biopsy.  Findings: On exam under anesthesia the penis was circumcised and without mass or lesion.  The scrotum was normal.  The testicles were descended bilaterally and palpably normal.  No inguinal hernia or lymphadenopathy.  On digital rectal exam the prostate was about 30 g and smooth without hard area or nodule.  On cystoscopy the urethra and the prostatic urethra were unremarkable.  Trigone and ureteral orifice ease in their normal orthotopic position.  Clear reflux bilaterally.  No stone or foreign body in the bladder.  2 small papillary tumors on the mid trigone 1 to the right and 1 to the left of midline.  Well away from the UO.  2 small tumors at the dome with fulguration up to 2 cm.  Left retrograde pyelogram-this outlined a single ureter single collecting system unit without filling defect, stricture or dilation.  Right retrograde pyelogram-this outlined a single ureter single collecting system unit without filling defect, stricture or dilation.  Description of procedure: After consent was obtained patient brought to the operating room.  After adequate anesthesia he was placed lithotomy position and prepped and draped in the usual sterile fashion.  A timeout was performed to confirm the patient and procedure.  An exam under anesthesia was performed.  The cystoscope was passed per urethra the bladder carefully inspected with a 30 degree and 70 degree lens.  The left ureteral orifice was cannulated with a 6 [place] open-ended catheter and retrograde injection of  contrast performed.  The right ureteral orifice was cannulated with a 6 [place] open-ended catheter and retrograde injection of contrast was performed.  I then used the cold cup rigid biopsy forceps to biopsy the trigone right and left.  The area was fulgurated.  At the dome the more posterior tumor had a very small area adjacent to it of some early papillary changes.  This was biopsied and the entire area fulgurated about 2 cm.  More anterior at the dome was another small tumor biopsied.  These 2 tumors were sent together.  All the areas were fulgurated and hemostasis was excellent under low pressure.  The scope was removed and a 16 French Foley catheter placed draining clear urine.  It was left to gravity drainage.  He was awakened and taken recovery in stable condition.  Complications: None  Blood loss: Minimal  Specimens to pathology: #1 right trigone #2 left trigone #3 dome  Instillation of epirubicin in PACU: Epirubicin 80 mg in 75 mL (80 mg in 45 ml was sent from pharmacy and I added 30 ml sterile saline flush to bring it up to 75 ml) was instilled per urethra and left indwelling for 50 minutes.  It was then drained and the Foley catheter removed.  Drains: Foley catheter 16 [place]  Disposition: Patient stable to PACU

## 2019-04-24 NOTE — Discharge Instructions (Signed)
Bladder Biopsy, Care After °Refer to this sheet in the next few weeks. These instructions provide you with information about caring for yourself after your procedure. Your health care provider may also give you more specific instructions. Your treatment has been planned according to current medical practices, but problems sometimes occur. Call your health care provider if you have any problems or questions after your procedure. °What can I expect after the procedure? °After the procedure, it is common to have: °· Mild pain in your bladder or kidney area during urination. °· Minor burning during urination. °· Small amounts of blood in your urine. °· A sudden urge to urinate. °· A need to urinate more often than usual. °Follow these instructions at home: °Medicines °· Take over-the-counter and prescription medicines only as told by your health care provider. °· If you were prescribed an antibiotic medicine, take it as told by your health care provider. Do not stop taking the antibiotic even if you start to feel better. °General instructions ° °· Take a warm bath to relieve any burning sensations around your urethra. °· Hold a warm, damp washcloth over the urethral area to ease pain. °· Return to your normal activities as told by your health care provider. Ask your health care provider what activities are safe for you. °· Do not drive for 24 hours if you received a medicine to help you relax (sedative) during your procedure. Ask your health care provider when it is safe for you to drive. °· It is your responsibility to get the results of your procedure. Ask your health care provider or the department performing the procedure when your results will be ready. °· Keep all follow-up visits as told by your health care provider. This is important. °Contact a health care provider if: °· You have a fever. °· Your symptoms do not improve within 24 hours and you continue to have: °? Burning during urination. °? Increasing  amounts of blood in your urine. °? Pain during urination. °? An urgent need to urinate. °? A need to urinate more often than usual. °Get help right away if: °· You have a lot of bleeding or more bleeding. °· You have severe pain. °· You are unable to urinate. °· You have bright red blood in your urine. °· You are passing blood clots in your urine. °· You have a fever. °· You have swelling, redness, or pain in your legs. °· You have difficulty breathing. °This information is not intended to replace advice given to you by your health care provider. Make sure you discuss any questions you have with your health care provider. °Document Released: 07/01/2015 Document Revised: 11/20/2015 Document Reviewed: 07/01/2015 °Elsevier Patient Education © 2020 [employer] ° °

## 2019-04-24 NOTE — Transfer of Care (Signed)
Immediate Anesthesia Transfer of Care Note  Patient: Ronald Atkins  Procedure(s) Performed: CYSTOSCOPY WITH BLADDER BIOPSY 0.5-2CM, FULGURATION/ EPIRUBICIN INSTILLATION/ BILATERAL RETROGRADE PYELOGRAM (N/A )  Patient Location: PACU  Anesthesia Type:General  Level of Consciousness: awake, oriented and patient cooperative  Airway & Oxygen Therapy: Patient Spontanous Breathing and Patient connected to face mask oxygen  Post-op Assessment: Report given to RN and Post -op Vital signs reviewed and stable  Post vital signs: Reviewed and stable  Last Vitals:  Vitals Value Taken Time  BP 134/99 04/24/19 1303  Temp    Pulse 70 04/24/19 1304  Resp 18 04/24/19 1304  SpO2 100 % 04/24/19 1304  Vitals shown include unvalidated device data.  Last Pain:  Vitals:   04/24/19 1043  TempSrc:   PainSc: 4       Patients Stated Pain Goal: 4 ([id number])  Complications: No apparent anesthesia complications

## 2019-04-24 NOTE — Anesthesia Preprocedure Evaluation (Signed)
Anesthesia Evaluation  Patient identified by MRN, date of birth, ID band Patient awake    Reviewed: Allergy & Precautions, NPO status , Patient's Chart, lab work & pertinent test results  Airway Mallampati: II  TM Distance: >3 FB Neck ROM: Full    Dental no notable dental hx.    Pulmonary Current Smoker and Patient abstained from smoking.,  80 pack year history   Pulmonary exam normal breath sounds clear to auscultation       Cardiovascular negative cardio ROS Normal cardiovascular exam Rhythm:Regular Rate:Normal     Neuro/Psych  Headaches, negative psych ROS   GI/Hepatic negative GI ROS, Neg liver ROS,   Endo/Other  negative endocrine ROS  Renal/GU negative Renal ROS   Bladder cancer Hx acute prostatitis     Musculoskeletal  (+) Arthritis , Osteoarthritis,  Degenerative disc disease   Abdominal (+) + obese,   Peds negative pediatric ROS (+)  Hematology negative hematology ROS (+)   Anesthesia Other Findings   Reproductive/Obstetrics negative OB ROS                            Anesthesia Physical  Anesthesia Plan  ASA: III  Anesthesia Plan: General   Post-op Pain Management:    Induction: Intravenous  PONV Risk Score and Plan: 2 and Ondansetron, Dexamethasone, Midazolam and Treatment may vary due to age or medical condition  Airway Management Planned: LMA  Additional Equipment: None  Intra-op Plan:   Post-operative Plan: Extubation in OR  Informed Consent: I have reviewed the patients History and Physical, chart, labs and discussed the procedure including the risks, benefits and alternatives for the proposed anesthesia with the patient or authorized representative who has indicated his/her understanding and acceptance.     Dental advisory given  Plan Discussed with: CRNA  Anesthesia Plan Comments:         Anesthesia Quick Evaluation

## 2019-04-25 ENCOUNTER — Encounter (HOSPITAL_COMMUNITY): Payer: Self-pay | Admitting: Urology

## 2019-04-25 DIAGNOSIS — C670 Malignant neoplasm of trigone of bladder: Secondary | ICD-10-CM | POA: Diagnosis not present

## 2019-04-25 LAB — SURGICAL PATHOLOGY

## 2019-05-14 ENCOUNTER — Other Ambulatory Visit: Payer: Self-pay | Admitting: Family Medicine

## 2019-05-14 DIAGNOSIS — K118 Other diseases of salivary glands: Secondary | ICD-10-CM

## 2019-05-15 ENCOUNTER — Other Ambulatory Visit: Payer: Self-pay | Admitting: Family Medicine

## 2019-05-15 DIAGNOSIS — F17200 Nicotine dependence, unspecified, uncomplicated: Secondary | ICD-10-CM

## 2019-06-01 ENCOUNTER — Ambulatory Visit
Admission: RE | Admit: 2019-06-01 | Discharge: 2019-06-01 | Disposition: A | Discharge status: Home / Self Care | Payer: BC Managed Care – PPO | Source: Ambulatory Visit | Attending: Family Medicine | Admitting: Family Medicine

## 2019-06-01 DIAGNOSIS — F17200 Nicotine dependence, unspecified, uncomplicated: Secondary | ICD-10-CM

## 2019-06-01 DIAGNOSIS — K118 Other diseases of salivary glands: Secondary | ICD-10-CM

## 2019-06-06 ENCOUNTER — Encounter (HOSPITAL_COMMUNITY): Payer: Self-pay | Admitting: Urology

## 2019-06-07 ENCOUNTER — Other Ambulatory Visit: Payer: Self-pay | Admitting: Family Medicine

## 2019-06-07 DIAGNOSIS — R9389 Abnormal findings on diagnostic imaging of other specified body structures: Secondary | ICD-10-CM

## 2019-07-06 ENCOUNTER — Other Ambulatory Visit: Payer: BC Managed Care – PPO

## 2019-07-06 ENCOUNTER — Inpatient Hospital Stay: Admission: RE | Admit: 2019-07-06 | Payer: BC Managed Care – PPO | Source: Ambulatory Visit

## 2019-07-13 ENCOUNTER — Ambulatory Visit
Admission: RE | Admit: 2019-07-13 | Discharge: 2019-07-13 | Disposition: A | Discharge status: Home / Self Care | Payer: BC Managed Care – PPO | Source: Ambulatory Visit | Attending: Family Medicine | Admitting: Family Medicine

## 2019-07-13 ENCOUNTER — Ambulatory Visit
Admission: RE | Admit: 2019-07-13 | Discharge: 2019-07-13 | Disposition: A | Discharge status: Home / Self Care | Payer: No Typology Code available for payment source | Source: Ambulatory Visit | Attending: Family Medicine | Admitting: Family Medicine

## 2019-07-13 DIAGNOSIS — R9389 Abnormal findings on diagnostic imaging of other specified body structures: Secondary | ICD-10-CM

## 2019-07-13 MED ORDER — IOPAMIDOL (ISOVUE-300) INJECTION 61%
75.0000 mL | Freq: Once | INTRAVENOUS | Status: AC | PRN
  Administered 2019-07-13: 75 mL via INTRAVENOUS

## 2020-02-28 ENCOUNTER — Other Ambulatory Visit: Payer: Self-pay | Admitting: Family Medicine

## 2020-02-28 DIAGNOSIS — R197 Diarrhea, unspecified: Secondary | ICD-10-CM

## 2020-03-14 ENCOUNTER — Ambulatory Visit
Admission: RE | Admit: 2020-03-14 | Discharge: 2020-03-14 | Disposition: A | Discharge status: Home / Self Care | Payer: BC Managed Care – PPO | Source: Ambulatory Visit | Attending: Family Medicine | Admitting: Family Medicine

## 2020-03-14 DIAGNOSIS — R197 Diarrhea, unspecified: Secondary | ICD-10-CM

## 2021-12-24 IMAGING — CT CT ABD-PELV W/O CM
2 of 4 series · 13 of 46 positions shown, 15 images · non-contrast
Comparison: None.

CLINICAL DATA: Right red blood per rectum, bilateral lower
abdominal pain, history of bladder cancer

EXAM:
CT ABDOMEN AND PELVIS WITHOUT CONTRAST
TECHNIQUE: Multidetector CT imaging of the abdomen and pelvis was performed
following the standard protocol without IV contrast. Unenhanced CT
was performed per clinician order. Lack of IV contrast limits
sensitivity and specificity, especially for evaluation of
abdominal/pelvic solid viscera.

[Series 2: routine abdomen pelvis without 5.00 br40 s3 axial · axial · non-contrast · 0.60mm/px · z∈[+1199,+1614]mm · 10 of 101 slices shown, 12 images]
[im 9/101  soft-tissue]
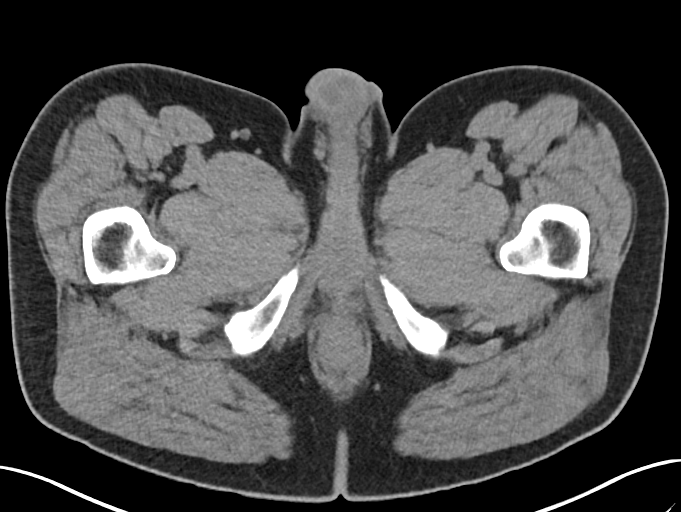
[im 9/101  bone]
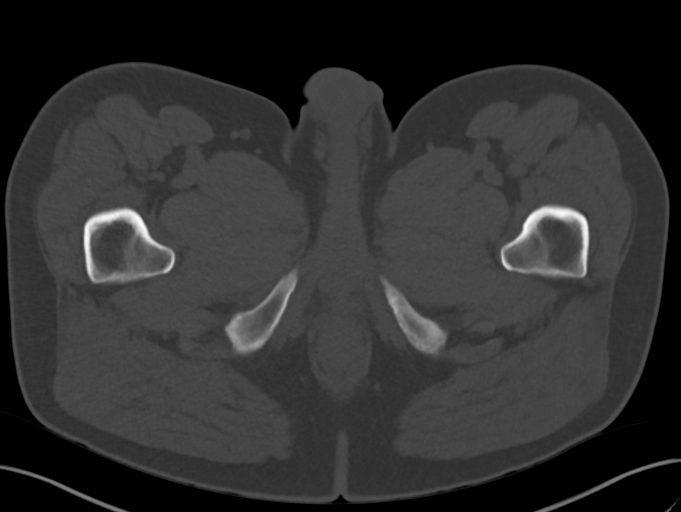
[im 17/101  soft-tissue]
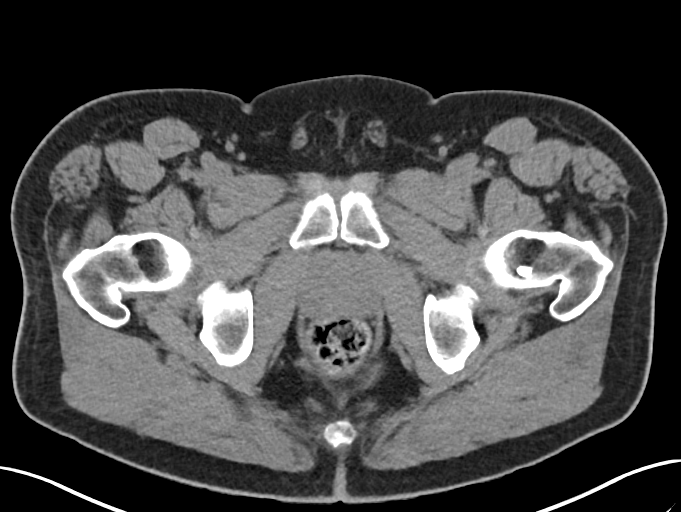
[im 26/101  soft-tissue]
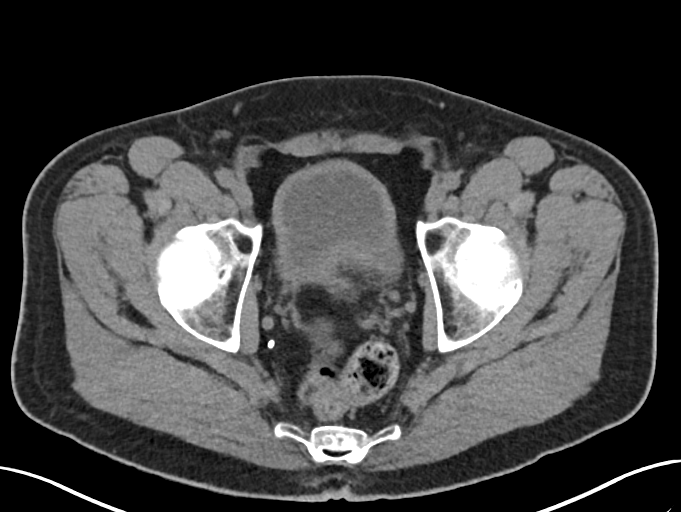
[im 38/101  soft-tissue]
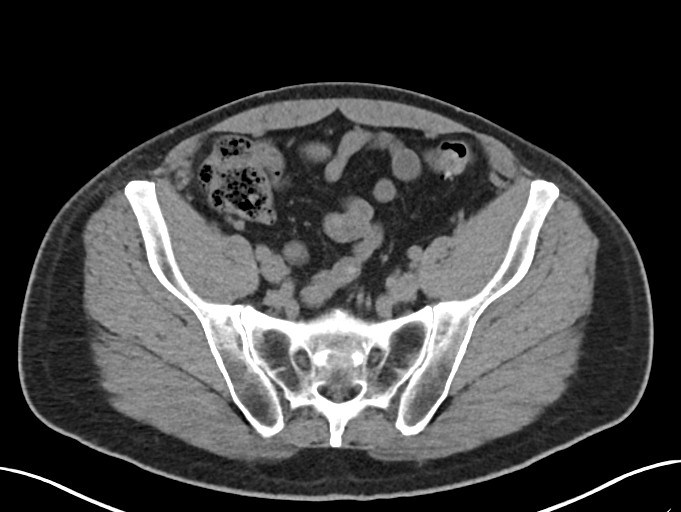
[im 46/101  soft-tissue]
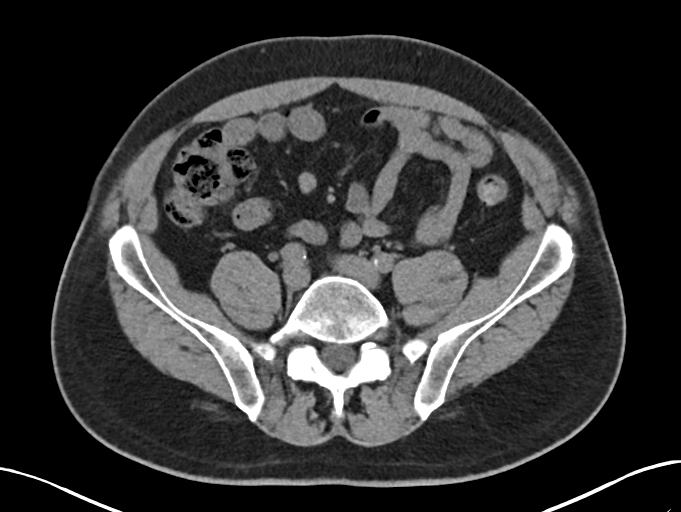
[im 55/101  soft-tissue]
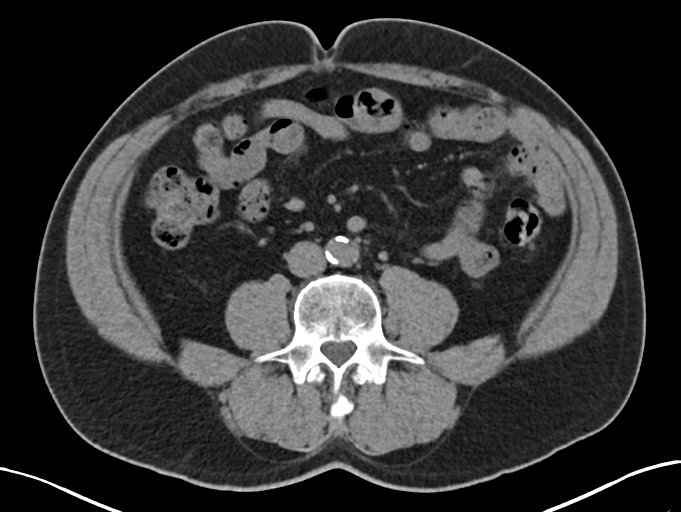
[im 63/101  soft-tissue]
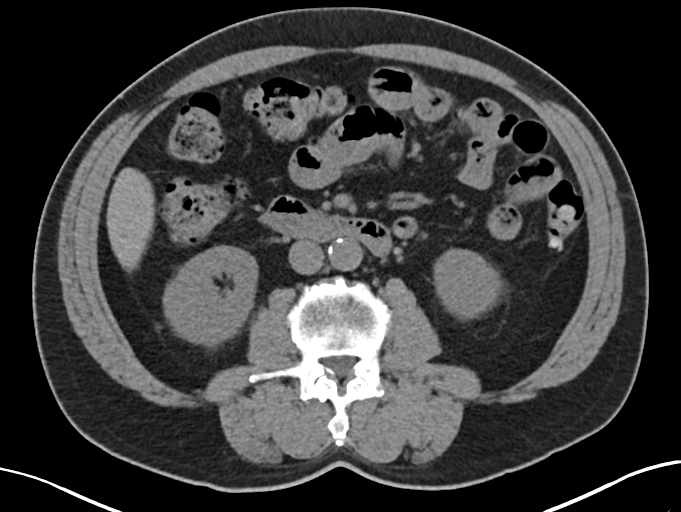
[im 76/101  soft-tissue]
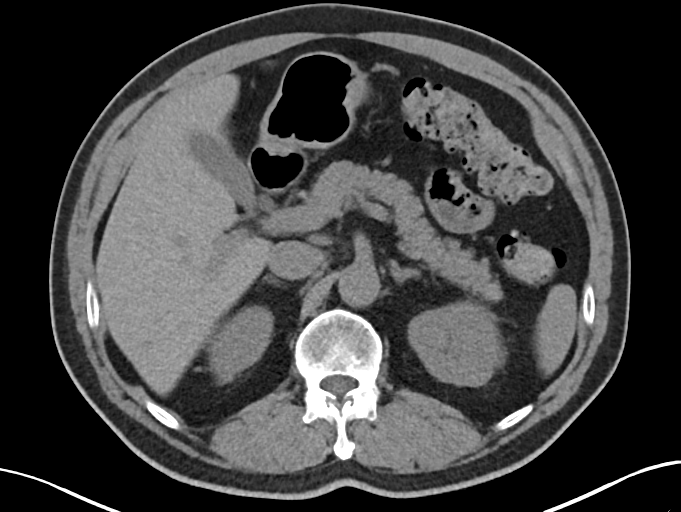
[im 84/101  soft-tissue]
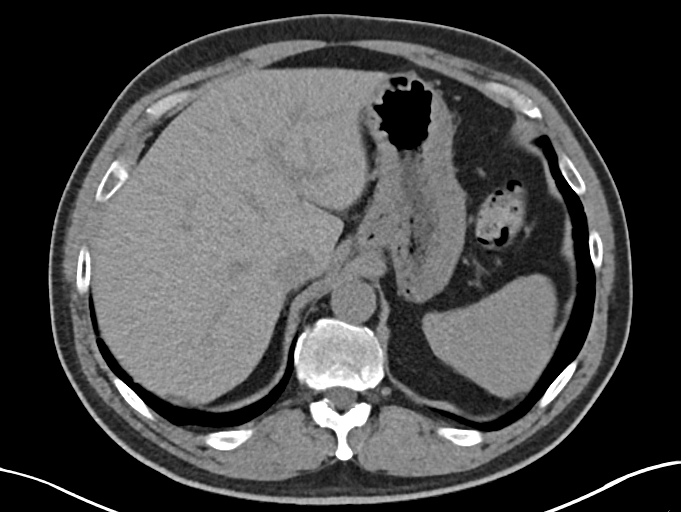
[im 84/101  bone]
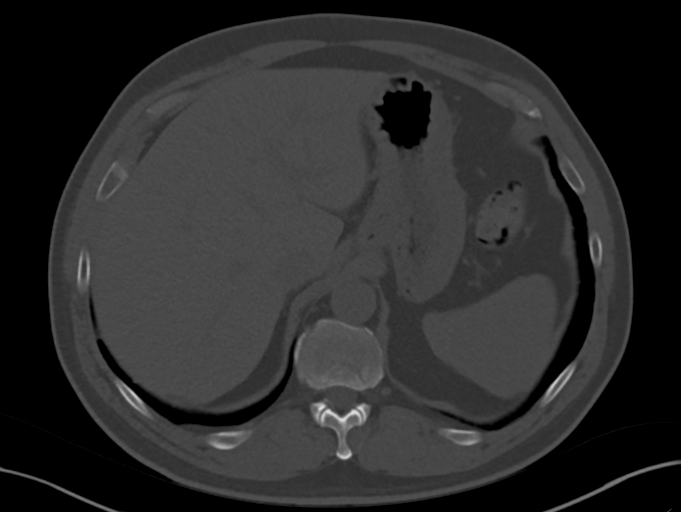
[im 92/101  soft-tissue]
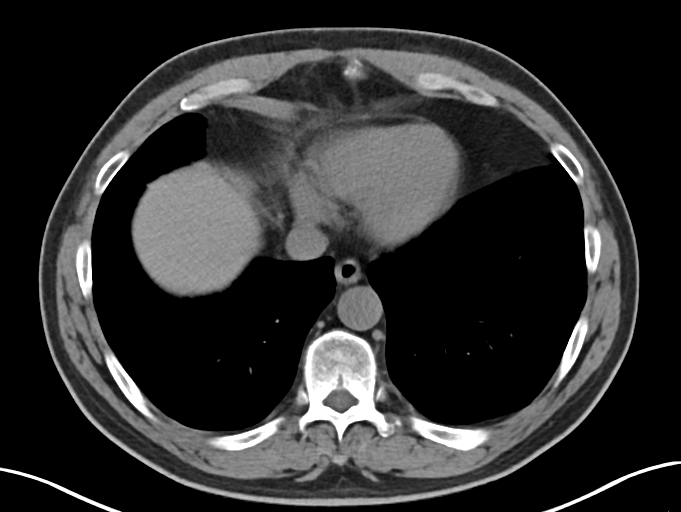

[Series 4: routine abdomen pelvis without 2.00 br40 s3 cor · coronal · non-contrast · 0.79mm/px · 3 of 152 slices shown]
[im 51/152  soft-tissue]
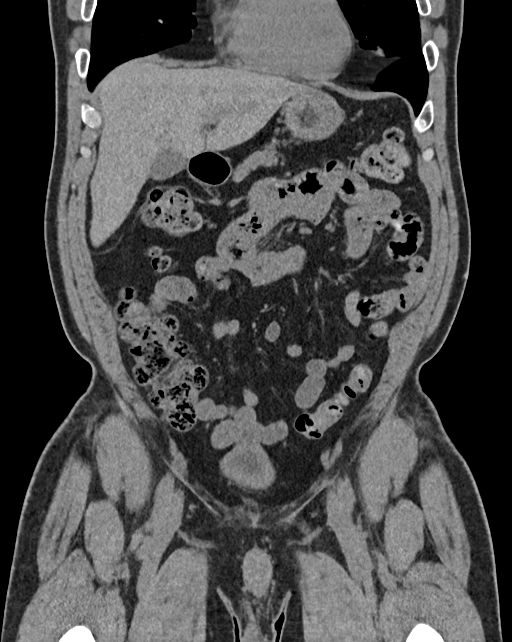
[im 68/152  soft-tissue]
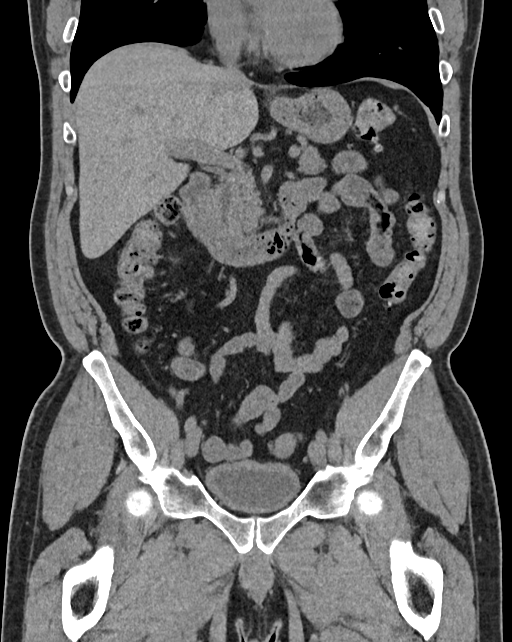
[im 84/152  soft-tissue]
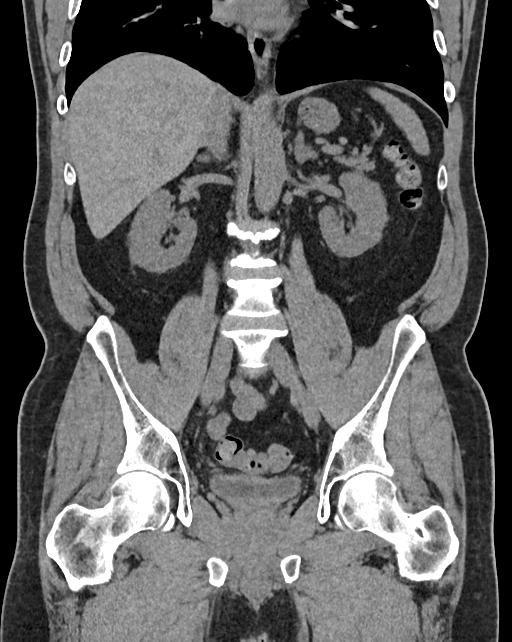

[13 of 46 positions shown; findings below may reference images not displayed]

FINDINGS: Lower chest: No acute pleural or parenchymal lung disease.

Hepatobiliary: No focal liver abnormality is seen. No gallstones,
gallbladder wall thickening, or biliary dilatation.

Pancreas: Unremarkable. No pancreatic ductal dilatation or
surrounding inflammatory changes.

Spleen: Normal in size without focal abnormality.

Adrenals/Urinary Tract: No urinary tract calculi or obstructive
uropathy. The adrenals are unremarkable. Bladder is minimally
distended with no focal abnormality.

Stomach/Bowel: No bowel obstruction or ileus. Normal appendix right
lower quadrant. There is scattered diverticulosis of the descending
and sigmoid colon without diverticulitis.

Vascular/Lymphatic: Aortic atherosclerosis. No enlarged abdominal or
pelvic lymph nodes.

Reproductive: Prostate is unremarkable.

Other: No free fluid or free gas.  No abdominal wall hernia.

Musculoskeletal: No acute or destructive bony lesions. Reconstructed
images demonstrate no additional findings.
IMPRESSION: 1. No urinary tract calculi or obstructive uropathy.
2. Diverticulosis without diverticulitis.
3. Aortic Atherosclerosis ([id number]).

## 2022-09-28 ENCOUNTER — Other Ambulatory Visit: Payer: Self-pay | Admitting: Urology

## 2022-11-09 NOTE — Progress Notes (Signed)
Anesthesia Review:  PCP: Cardiologist : Chest x-ray : CT Chest- 2020  EKG:  CT CArd scoring- 2021  Echo : Stress test: Cardiac Cath :  Activity level:  Sleep Study/ CPAP : Fasting Blood Sugar :      / Checks Blood Sugar -- times a day:   Blood Thinner/ Instructions /Last Dose: ASA / Instructions/ Last Dose :

## 2022-11-10 NOTE — Patient Instructions (Addendum)
SURGICAL WAITING ROOM VISITATION  Patients having surgery or a procedure may have no more than 2 support people in the waiting area - these visitors may rotate.    Children under the age of 69 must have an adult with them who is not the patient.  If the patient needs to stay at the hospital during part of their recovery, the visitor guidelines for inpatient rooms apply. Pre-op nurse will coordinate an appropriate time for 1 support person to accompany patient in pre-op.  This support person may not rotate.    Please refer to the [facility] website for the visitor guidelines for Inpatients (after your surgery is over and you are in a regular room).       Your procedure is scheduled on: 11/19/22    Report to [facility] Main Entrance    Report to admitting at   [phone]   Call this number if you have problems the morning of surgery [phone]   Do not eat food  or drink liquids :After Midnight.                                Oral Hygiene is also important to reduce your risk of infection.                                    Remember - BRUSH YOUR TEETH THE MORNING OF SURGERY WITH YOUR REGULAR TOOTHPASTE  DENTURES WILL BE REMOVED PRIOR TO SURGERY PLEASE DO NOT APPLY "Poly grip" OR ADHESIVES!!!   Do NOT smoke after Midnight   Take these medicines the morning of surgery with A SIP OF WATER: rosuvastatin, nasal spray, inhaler      Bring CPAP mask and tubing day of surgery.                              You may not have any metal on your body including hair pins, jewelry, and body piercing             Do not wear lotions, powders, perfumes/cologne, or deodorant               Men may shave face and neck.   Do not bring valuables to the hospital. [facility] IS NOT             RESPONSIBLE   FOR VALUABLES.   Contacts, glasses, dentures or bridgework may not be worn into surgery.   Bring small overnight bag day of surgery.   DO NOT BRING YOUR HOME MEDICATIONS TO THE  HOSPITAL. PHARMACY WILL DISPENSE MEDICATIONS LISTED ON YOUR MEDICATION LIST TO YOU DURING YOUR ADMISSION IN THE HOSPITAL!    Patients discharged on the day of surgery will not be allowed to drive home.  Someone NEEDS to stay with you for the first 24 hours after anesthesia.   Special Instructions: Bring a copy of your healthcare power of attorney and living will documents the day of surgery if you haven't scanned them before.              Please read over the following fact sheets you were given: IF YOU HAVE QUESTIONS ABOUT YOUR PRE-OP INSTRUCTIONS PLEASE CALL [phone]    If you test positive for Covid or have been in contact with anyone that has tested positive  in the last 10 days please notify you surgeon.    [facility] - Preparing for Surgery Before surgery, you can play an important role.  Because skin is not sterile, your skin needs to be as free of germs as possible.  You can reduce the number of germs on your skin by washing with CHG (chlorahexidine gluconate) soap before surgery.  CHG is an antiseptic cleaner which kills germs and bonds with the skin to continue killing germs even after washing. Please DO NOT use if you have an allergy to CHG or antibacterial soaps.  If your skin becomes reddened/irritated stop using the CHG and inform your nurse when you arrive at Short Stay. Do not shave (including legs and underarms) for at least 48 hours prior to the first CHG shower.  You may shave your face/neck. Please follow these instructions carefully:  1.  Shower with CHG Soap the night before surgery and the  morning of Surgery.  2.  If you choose to wash your hair, wash your hair first as usual with your  normal  shampoo.  3.  After you shampoo, rinse your hair and body thoroughly to remove the  shampoo.                           4.  Use CHG as you would any other liquid soap.  You can apply chg directly  to the skin and wash                       Gently with a scrungie or clean  washcloth.  5.  Apply the CHG Soap to your body ONLY FROM THE NECK DOWN.   Do not use on face/ open                           Wound or open sores. Avoid contact with eyes, ears mouth and genitals (private parts).                       Wash face,  Genitals (private parts) with your normal soap.             6.  Wash thoroughly, paying special attention to the area where your surgery  will be performed.  7.  Thoroughly rinse your body with warm water from the neck down.  8.  DO NOT shower/wash with your normal soap after using and rinsing off  the CHG Soap.                9.  Pat yourself dry with a clean towel.            10.  Wear clean pajamas.            11.  Place clean sheets on your bed the night of your first shower and do not  sleep with pets. Day of Surgery : Do not apply any lotions/deodorants the morning of surgery.  Please wear clean clothes to the hospital/surgery center.  FAILURE TO FOLLOW THESE INSTRUCTIONS MAY RESULT IN THE CANCELLATION OF YOUR SURGERY PATIENT SIGNATURE_________________________________  NURSE SIGNATURE__________________________________  ________________________________________________________________________

## 2022-11-12 ENCOUNTER — Encounter (HOSPITAL_COMMUNITY): Payer: Self-pay

## 2022-11-12 ENCOUNTER — Other Ambulatory Visit: Payer: Self-pay

## 2022-11-12 ENCOUNTER — Encounter (HOSPITAL_COMMUNITY)
Admission: RE | Admit: 2022-11-12 | Discharge: 2022-11-12 | Disposition: A | Discharge status: Home / Self Care | Payer: BC Managed Care – PPO | Source: Ambulatory Visit | Attending: Urology | Admitting: Urology

## 2022-11-12 VITALS — BP 125/85 | HR 75 | Temp 98.6°F | Resp 16 | Ht 69.0 in | Wt 203.0 lb

## 2022-11-12 DIAGNOSIS — Z01818 Encounter for other preprocedural examination: Secondary | ICD-10-CM | POA: Insufficient documentation

## 2022-11-12 HISTORY — DX: Chronic obstructive pulmonary disease, unspecified: J44.9

## 2022-11-12 HISTORY — DX: Anxiety disorder, unspecified: F41.9

## 2022-11-12 LAB — CBC
HCT: 47.4 % (ref 39.0–52.0)
Hemoglobin: 16.1 g/dL (ref 13.0–17.0)
MCH: 30.9 pg (ref 26.0–34.0)
MCHC: 34 g/dL (ref 30.0–36.0)
MCV: 91 fL (ref 80.0–100.0)
Platelets: 219 10*3/uL (ref 150–400)
RBC: 5.21 MIL/uL (ref 4.22–5.81)
RDW: 12.2 % (ref 11.5–15.5)
WBC: 9.3 10*3/uL (ref 4.0–10.5)
nRBC: 0 % (ref 0.0–0.2)

## 2022-11-12 LAB — BASIC METABOLIC PANEL
Anion gap: 8 (ref 5–15)
BUN: 16 mg/dL (ref 8–23)
CO2: 26 mmol/L (ref 22–32)
Calcium: 9.3 mg/dL (ref 8.9–10.3)
Chloride: 103 mmol/L (ref 98–111)
Creatinine, Ser: 0.86 mg/dL (ref 0.61–1.24)
GFR, Estimated: >60 mL/min (ref >60)
Glucose, Bld: 101 mg/dL — ABNORMAL HIGH (ref 70–99)
Potassium: 3.9 mmol/L (ref 3.5–5.1)
Sodium: 137 mmol/L (ref 135–145)

## 2022-11-12 NOTE — Progress Notes (Addendum)
Anesthesia Review:  PCP: Dr. Maryelizabeth Rowan  Cardiologist :no  Chest x-ray : CT Chest- 2020  EKG:  CT CArd scoring- 2021  Echo : Stress test: Cardiac Cath :   Activity level: Able climb a flight of stairs without SOB or CP  Sleep Study/ CPAP :  Fasting Blood Sugar :      / Checks Blood Sugar -- times a day:    Blood Thinner/ Instructions /Last Dose: ASA / Instructions/ Last Dose :

## 2022-11-18 NOTE — Anesthesia Preprocedure Evaluation (Addendum)
Anesthesia Evaluation  Patient identified by MRN, date of birth, ID band Patient awake    Reviewed: Allergy & Precautions, NPO status , Patient's Chart, lab work & pertinent test results  History of Anesthesia Complications Negative for: history of anesthetic complications  Airway Mallampati: II  TM Distance: >3 FB Neck ROM: Full    Dental  (+) Caps, Dental Advisory Given   Pulmonary COPD,  COPD inhaler, Current Smoker and Patient abstained from smoking.   breath sounds clear to auscultation       Cardiovascular hypertension, Pt. on medications (-) angina  Rhythm:Regular Rate:Normal     Neuro/Psych  Headaches  Anxiety     DDD tinnitus    GI/Hepatic negative GI ROS, Neg liver ROS,,,  Endo/Other  BMI 30  Renal/GU negative Renal ROS   Bladder cancer    Musculoskeletal   Abdominal   Peds  Hematology negative hematology ROS (+)   Anesthesia Other Findings   Reproductive/Obstetrics                             Anesthesia Physical Anesthesia Plan  ASA: 2  Anesthesia Plan: General   Post-op Pain Management: Tylenol PO (pre-op)*   Induction: Intravenous  PONV Risk Score and Plan: 1 and Ondansetron and Dexamethasone  Airway Management Planned: Oral ETT  Additional Equipment: None  Intra-op Plan:   Post-operative Plan: Extubation in OR  Informed Consent: I have reviewed the patients History and Physical, chart, labs and discussed the procedure including the risks, benefits and alternatives for the proposed anesthesia with the patient or authorized representative who has indicated his/her understanding and acceptance.     Dental advisory given  Plan Discussed with: CRNA and Surgeon  Anesthesia Plan Comments:         Anesthesia Quick Evaluation

## 2022-11-19 ENCOUNTER — Encounter (HOSPITAL_COMMUNITY)
Admission: RE | Disposition: A | Discharge status: Home / Self Care | Payer: Self-pay | Source: Home / Self Care | Attending: Urology

## 2022-11-19 ENCOUNTER — Ambulatory Visit (HOSPITAL_COMMUNITY): Payer: BC Managed Care – PPO | Admitting: Anesthesiology

## 2022-11-19 ENCOUNTER — Ambulatory Visit (HOSPITAL_COMMUNITY)
Admission: RE | Admit: 2022-11-19 | Discharge: 2022-11-19 | Disposition: A | Discharge status: Home / Self Care | Payer: BC Managed Care – PPO | Attending: Urology | Admitting: Urology

## 2022-11-19 ENCOUNTER — Encounter (HOSPITAL_COMMUNITY): Payer: Self-pay | Admitting: Urology

## 2022-11-19 ENCOUNTER — Other Ambulatory Visit: Payer: Self-pay

## 2022-11-19 DIAGNOSIS — F17200 Nicotine dependence, unspecified, uncomplicated: Secondary | ICD-10-CM | POA: Diagnosis not present

## 2022-11-19 DIAGNOSIS — F419 Anxiety disorder, unspecified: Secondary | ICD-10-CM | POA: Insufficient documentation

## 2022-11-19 DIAGNOSIS — J449 Chronic obstructive pulmonary disease, unspecified: Secondary | ICD-10-CM | POA: Insufficient documentation

## 2022-11-19 DIAGNOSIS — C671 Malignant neoplasm of dome of bladder: Secondary | ICD-10-CM | POA: Insufficient documentation

## 2022-11-19 DIAGNOSIS — Z79899 Other long term (current) drug therapy: Secondary | ICD-10-CM | POA: Insufficient documentation

## 2022-11-19 DIAGNOSIS — I10 Essential (primary) hypertension: Secondary | ICD-10-CM | POA: Diagnosis not present

## 2022-11-19 DIAGNOSIS — C675 Malignant neoplasm of bladder neck: Secondary | ICD-10-CM | POA: Insufficient documentation

## 2022-11-19 DIAGNOSIS — C678 Malignant neoplasm of overlapping sites of bladder: Secondary | ICD-10-CM

## 2022-11-19 DIAGNOSIS — Z01818 Encounter for other preprocedural examination: Secondary | ICD-10-CM

## 2022-11-19 HISTORY — PX: TRANSURETHRAL RESECTION OF BLADDER TUMOR WITH MITOMYCIN-C: SHX6459

## 2022-11-19 SURGERY — TRANSURETHRAL RESECTION OF BLADDER TUMOR WITH MITOMYCIN-C
Anesthesia: General

## 2022-11-19 MED ORDER — EPHEDRINE SULFATE-NACL 50-0.9 MG/10ML-% IV SOSY
PREFILLED_SYRINGE | INTRAVENOUS | Status: DC | PRN
  Administered 2022-11-19: 5 mg via INTRAVENOUS

## 2022-11-19 MED ORDER — DEXAMETHASONE SODIUM PHOSPHATE 10 MG/ML IJ SOLN
INTRAMUSCULAR | Status: AC
  Filled 2022-11-19: qty 1

## 2022-11-19 MED ORDER — HYDROMORPHONE HCL 1 MG/ML IJ SOLN
0.2500 mg | INTRAMUSCULAR | Status: DC | PRN
  Administered 2022-11-19: 0.25 mg via INTRAVENOUS

## 2022-11-19 MED ORDER — FENTANYL CITRATE (PF) 100 MCG/2ML IJ SOLN
INTRAMUSCULAR | Status: DC | PRN
  Administered 2022-11-19 (×2): 50 ug via INTRAVENOUS

## 2022-11-19 MED ORDER — ACETAMINOPHEN 500 MG PO TABS
1000.0000 mg | ORAL_TABLET | Freq: Once | ORAL | Status: AC
  Administered 2022-11-19: 1000 mg via ORAL
  Filled 2022-11-19: qty 2

## 2022-11-19 MED ORDER — DEXAMETHASONE SODIUM PHOSPHATE 10 MG/ML IJ SOLN
INTRAMUSCULAR | Status: DC | PRN
  Administered 2022-11-19: 8 mg via INTRAVENOUS

## 2022-11-19 MED ORDER — MIDAZOLAM HCL 2 MG/2ML IJ SOLN: 0.5000 mg | Freq: Once | INTRAMUSCULAR | Status: DC | PRN

## 2022-11-19 MED ORDER — SODIUM CHLORIDE 0.9 % IR SOLN
Status: DC | PRN
  Administered 2022-11-19 (×4): 3000 mL via INTRAVESICAL
  Administered 2022-11-19: 6000 mL via INTRAVESICAL
  Administered 2022-11-19: 3000 mL via INTRAVESICAL

## 2022-11-19 MED ORDER — PHENYLEPHRINE 80 MCG/ML (10ML) SYRINGE FOR IV PUSH (FOR BLOOD PRESSURE SUPPORT)
PREFILLED_SYRINGE | INTRAVENOUS | Status: AC
  Filled 2022-11-19: qty 10

## 2022-11-19 MED ORDER — ONDANSETRON HCL 4 MG/2ML IJ SOLN
INTRAMUSCULAR | Status: DC | PRN
  Administered 2022-11-19: 4 mg via INTRAVENOUS

## 2022-11-19 MED ORDER — ROCURONIUM BROMIDE 10 MG/ML (PF) SYRINGE
PREFILLED_SYRINGE | INTRAVENOUS | Status: DC | PRN
  Administered 2022-11-19: 60 mg via INTRAVENOUS

## 2022-11-19 MED ORDER — GEMCITABINE CHEMO FOR BLADDER INSTILLATION 2000 MG: 2000.0000 mg | Freq: Once | INTRAVENOUS | Status: DC

## 2022-11-19 MED ORDER — PROPOFOL 10 MG/ML IV BOLUS
INTRAVENOUS | Status: DC | PRN
  Administered 2022-11-19: 130 mg via INTRAVENOUS

## 2022-11-19 MED ORDER — PROPOFOL 10 MG/ML IV BOLUS
INTRAVENOUS | Status: AC
  Filled 2022-11-19: qty 20

## 2022-11-19 MED ORDER — CHLORHEXIDINE GLUCONATE 0.12 % MT SOLN
15.0000 mL | Freq: Once | OROMUCOSAL | Status: AC
  Administered 2022-11-19: 15 mL via OROMUCOSAL

## 2022-11-19 MED ORDER — SUGAMMADEX SODIUM 200 MG/2ML IV SOLN
INTRAVENOUS | Status: DC | PRN
  Administered 2022-11-19: 200 mg via INTRAVENOUS

## 2022-11-19 MED ORDER — LACTATED RINGERS IV SOLN: INTRAVENOUS | Status: DC

## 2022-11-19 MED ORDER — FENTANYL CITRATE (PF) 100 MCG/2ML IJ SOLN
INTRAMUSCULAR | Status: AC
  Filled 2022-11-19: qty 2

## 2022-11-19 MED ORDER — MIDAZOLAM HCL 2 MG/2ML IJ SOLN
INTRAMUSCULAR | Status: AC
  Filled 2022-11-19: qty 2

## 2022-11-19 MED ORDER — MEPERIDINE HCL 50 MG/ML IJ SOLN: 6.2500 mg | INTRAMUSCULAR | Status: DC | PRN

## 2022-11-19 MED ORDER — ORAL CARE MOUTH RINSE: 15.0000 mL | Freq: Once | OROMUCOSAL | Status: AC

## 2022-11-19 MED ORDER — ONDANSETRON HCL 4 MG/2ML IJ SOLN
INTRAMUSCULAR | Status: AC
  Filled 2022-11-19: qty 2

## 2022-11-19 MED ORDER — LIDOCAINE 2% (20 MG/ML) 5 ML SYRINGE
INTRAMUSCULAR | Status: DC | PRN
  Administered 2022-11-19: 20 mg via INTRAVENOUS

## 2022-11-19 MED ORDER — MIDAZOLAM HCL 5 MG/5ML IJ SOLN
INTRAMUSCULAR | Status: DC | PRN
  Administered 2022-11-19: 2 mg via INTRAVENOUS

## 2022-11-19 MED ORDER — GEMCITABINE CHEMO FOR BLADDER INSTILLATION 2000 MG
2000.0000 mg | Freq: Once | INTRAVENOUS | Status: AC
  Administered 2022-11-19: 2000 mg via INTRAVESICAL
  Filled 2022-11-19: qty 2000

## 2022-11-19 MED ORDER — PHENYLEPHRINE 80 MCG/ML (10ML) SYRINGE FOR IV PUSH (FOR BLOOD PRESSURE SUPPORT)
PREFILLED_SYRINGE | INTRAVENOUS | Status: DC | PRN
  Administered 2022-11-19: 40 ug via INTRAVENOUS
  Administered 2022-11-19: 120 ug via INTRAVENOUS
  Administered 2022-11-19 (×2): 80 ug via INTRAVENOUS

## 2022-11-19 MED ORDER — HYDROMORPHONE HCL 1 MG/ML IJ SOLN
INTRAMUSCULAR | Status: AC
  Administered 2022-11-19: 0.25 mg via INTRAVENOUS
  Filled 2022-11-19: qty 1

## 2022-11-19 MED ORDER — CEFAZOLIN SODIUM-DEXTROSE 2-4 GM/100ML-% IV SOLN
2.0000 g | INTRAVENOUS | Status: AC
  Administered 2022-11-19: 2 g via INTRAVENOUS
  Filled 2022-11-19: qty 100

## 2022-11-19 MED ORDER — PROMETHAZINE HCL 25 MG/ML IJ SOLN: 6.2500 mg | INTRAMUSCULAR | Status: DC | PRN

## 2022-11-19 SURGICAL SUPPLY — 15 items
BAG DRN RND TRDRP ANRFLXCHMBR (UROLOGICAL SUPPLIES)
BAG URINE DRAIN 2000ML AR STRL (UROLOGICAL SUPPLIES) IMPLANT
BAG URO CATCHER STRL LF (MISCELLANEOUS) ×1 IMPLANT
DRAPE FOOT SWITCH (DRAPES) ×1 IMPLANT
GLOVE SURG LX STRL 7.5 STRW (GLOVE) ×1 IMPLANT
GOWN STRL REUS W/ TWL XL LVL3 (GOWN DISPOSABLE) ×1 IMPLANT
GOWN STRL REUS W/TWL XL LVL3 (GOWN DISPOSABLE) ×1
KIT TURNOVER KIT A (KITS) IMPLANT
LOOP CUT BIPOLAR 24F LRG (ELECTROSURGICAL) IMPLANT
MANIFOLD NEPTUNE II (INSTRUMENTS) ×1 IMPLANT
PACK CYSTO (CUSTOM PROCEDURE TRAY) ×1 IMPLANT
TRAY FOL W/BAG SLVR 16FR STRL (SET/KITS/TRAYS/PACK) IMPLANT
TRAY FOLEY W/BAG SLVR 16FR LF (SET/KITS/TRAYS/PACK) ×1
TUBING CONNECTING 10 (TUBING) ×1 IMPLANT
TUBING UROLOGY SET (TUBING) ×1 IMPLANT

## 2022-11-19 NOTE — Transfer of Care (Signed)
Immediate Anesthesia Transfer of Care Note  Patient: Ronald Atkins  Procedure(s) Performed: TRANSURETHRAL RESECTION OF BLADDER TUMOR  Patient Location: PACU  Anesthesia Type:General  Level of Consciousness: awake, alert , oriented, and patient cooperative  Airway & Oxygen Therapy: Patient Spontanous Breathing and Patient connected to face mask oxygen  Post-op Assessment: Report given to RN, Post -op Vital signs reviewed and stable, and Patient moving all extremities  Post vital signs: Reviewed and stable  Last Vitals:  Vitals Value Taken Time  BP 110/88 11/19/22 0902  Temp    Pulse 82 11/19/22 0904  Resp 21 11/19/22 0904  SpO2 100 % 11/19/22 0904  Vitals shown include unvalidated device data.  Last Pain:  Vitals:   11/19/22 0551  TempSrc:   PainSc: 0-No pain         Complications: No notable events documented.

## 2022-11-19 NOTE — Anesthesia Postprocedure Evaluation (Signed)
Anesthesia Post Note  Patient: Ronald Atkins  Procedure(s) Performed: TRANSURETHRAL RESECTION OF BLADDER TUMOR     Patient location during evaluation: PACU Anesthesia Type: General Level of consciousness: awake and alert, patient cooperative and oriented Pain management: pain level controlled Vital Signs Assessment: post-procedure vital signs reviewed and stable Respiratory status: spontaneous breathing, nonlabored ventilation and respiratory function stable Cardiovascular status: blood pressure returned to baseline and stable Postop Assessment: no apparent nausea or vomiting and adequate PO intake Anesthetic complications: no   No notable events documented.  Last Vitals:  Vitals:   11/19/22 0915 11/19/22 0930  BP: 112/69 103/66  Pulse: 75 73  Resp: 15 15  Temp:    SpO2: 92% 94%    Last Pain:  Vitals:   11/19/22 0915  TempSrc:   PainSc: 5                  Lew Prout,E. Laterra Lubinski

## 2022-11-19 NOTE — H&P (Signed)
H&P  Chief Complaint: Bladder cancer overlapping  History of Present Illness: Ronald Atkins is a 67 year old male with a history of bladder cancer.  He had multiple recurrences of low-grade TA since 2016.  He was lost to follow-up recently returned.  On cystoscopy he had multiple papillary tumors scattered throughout the bladder.  CT scan March 2024 was benign with small tumor seen in the bladder but no metastatic disease.  He has been well without dysuria or gross hematuria.  No cough cold or congestion.  No fever.  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   History of left shoulder fracture 1981   Military accident   History of migraine    from neck pain   History of rib fracture 1981   Military accident   Lump in neck    Left side   Lung nodule 11/27/2014   3mm subpleural nodule left lung base   Pneumonia    remote history of walking pneumonia   Tinnitus    Past Surgical History:  Procedure Laterality Date   COLONOSCOPY     CYSTOSCOPY WITH BIOPSY N/A 08/20/2016   Procedure: CYSTOSCOPY WITH BIOPSY AND FULGURATION INSTILL EPIRUBICIN;  Surgeon: Jerilee Field, MD;  Location: [facility];  Service: Urology;  Laterality: N/A;   CYSTOSCOPY WITH BIOPSY N/A 04/24/2019   Procedure: CYSTOSCOPY WITH BLADDER BIOPSY 0.5-2CM, FULGURATION/ EPIRUBICIN INSTILLATION/ BILATERAL RETROGRADE PYELOGRAM;  Surgeon: Jerilee Field, MD;  Location: [facility];  Service: Urology;  Laterality: N/A;   CYSTOSCOPY WITH FULGERATION N/A 11/03/2018   Procedure: CYSTOSCOPY WITH FULGERATION/ BLADDER BIOPSY/INSTILLATION OF  GEMCITABINE;  Surgeon: Jerilee Field, MD;  Location: [facility];  Service: Urology;  Laterality: N/A;   FRACTURE SURGERY  age 25 (approx)   military service injury   HAND SURGERY  [phone]   Left small finger after crushing accident   TRANSURETHRAL RESECTION OF BLADDER TUMOR N/A 02/11/2015   Procedure: TRANSURETHRAL RESECTION OF BLADDER TUMOR (TURBT) ;  Surgeon: Jerilee Field, MD;  Location: [facility];  Service: Urology;  Laterality: N/A;   TRANSURETHRAL RESECTION OF BLADDER TUMOR N/A 03/10/2018   Procedure: TRANSURETHRAL RESECTION OF BLADDER TUMOR (TURBT)/ CYSTOSCOPY/WITH BILATERAL RETROGRADES, POST OPERATIVE INSTILLATION OF CHEMOTHERAPY IN PACU;  Surgeon: Jerilee Field, MD;  Location: [facility];  Service: Urology;  Laterality: N/A;    Home Medications:  Medications Prior to Admission  Medication Sig Dispense Refill Last Dose   Azelastine HCl 137 MCG/SPRAY SOLN Place 2 sprays into both nostrils 2 (two) times daily.   11/18/2022   BREZTRI AEROSPHERE 160-9-4.8 MCG/ACT AERO Inhale 2 puffs into the lungs daily.   11/19/2022   Multiple Vitamin (MULTIVITAMIN) tablet Take 1 tablet by mouth daily.   Past Month   olmesartan-hydrochlorothiazide (BENICAR HCT) 40-12.5 MG tablet Take 0.5 tablets by mouth daily.   11/18/2022   rosuvastatin (CRESTOR) 5 MG tablet Take 5 mg by mouth daily.   11/18/2022   Allergies:  Allergies  Allergen Reactions   Oxycodone-Acetaminophen Itching    Not allergic to tylenol  Family History  Problem Relation Age of Onset   Heart disease Mother    Social History:  reports that he has been smoking cigarettes. He has a 20.00 pack-year smoking history. He has never used smokeless tobacco. He reports current alcohol use of about 1.0 standard drink of alcohol per week. He reports that he does not use drugs.  ROS: A complete review of systems was performed.  All systems are negative except for pertinent findings as noted. Review of  Systems  All other systems reviewed and are negative.    Physical Exam:  Vital signs in last 24 hours: Temp:  [98 F (36.7 C)] 98 F (36.7 C) (05/24 0543) Pulse Rate:  [71] 71 (05/24 0543) Resp:  [16] 16 (05/24 0543) BP: (110)/(78) 110/78 (05/24 0543) SpO2:  [98 %] 98 % (05/24 0543) Weight:  [92.1 kg] 92.1 kg (05/24 0551) General:  Alert and oriented, No acute distress HEENT: Normocephalic, atraumatic Cardiovascular: Regular rate and rhythm Lungs: Regular rate and effort Abdomen: Soft, nontender, nondistended, no abdominal masses Back: No CVA tenderness Extremities: No edema Neurologic: Grossly intact  Laboratory Data:  No results found for this or any previous visit (from the past 24 hour(s)). No results found for this or any previous visit (from the past 240 hour(s)). Creatinine: Recent Labs    11/12/22 1143  CREATININE 0.86    Impression/Assessment/Plan:  I discussed with the patient and his wife the nature, potential benefits, risks and alternatives to TURBT with postoperative instillation of gemcitabine, including side effects of the proposed treatment, the likelihood of the patient achieving the goals of the procedure, and any potential problems that might occur during the procedure or recuperation.  We discussed he may need a Foley catheter potentially for a few weeks depending on how the bladder looks.  Discussed risk of bladder perforation among others.  All questions answered. Patient elects to proceed.  In the future discussed importance of regular follow-up to discover recurrences earlier and when they are less numerous.    Jerilee Field 11/19/2022, 7:33 AM

## 2022-11-19 NOTE — Op Note (Signed)
Preoperative diagnosis: Bladder cancer Post part of diagnosis: Same  Procedure: TURBT 2 to 5 cm; postoperative instillation of gemcitabine in PACU  Surgeon: Mena Goes  Anesthesia: General  Indication for procedure: Ronald Atkins is a 67 year old male with a history of low-grade TA bladder cancer.  Recent office cystoscopy revealed several bladder tumor recurrences.  CT scan benign for metastatic disease or locally advanced disease.  Findings: On exam under anesthesia the penis was circumcised and without mass or lesions.  The glans and meatus appeared normal.  Testicles descended bilaterally and palpably normal.  On DRE prostate was about 40 g and smooth without hard area or nodule.  No bladder mass palpable on bimanual.  On cystoscopy there was a tumor at the right bladder neck, the dome, the left bladder neck, left bladder neck anterior and to posterior.  There were 2 or 3 other very small early recurrences that were fulgurated.  Description of procedure: After consent was obtained patient brought to the operating room.  After adequate anesthesia she is placed in lithotomy position.  Exam under anesthesia was performed.  He was prepped and draped in the usual sterile fashion.  Timeout was performed to confirm the patient and procedure.  Cystoscope was passed per urethra and the bladder inspected with a 30 and 70 degree lens.  I then swapped that out for the continuous-flow sheath passed with the visual obturator.  I then inserted the handle and loop.  Started at the right bladder neck and resected this tumor.  This was sent as right bladder.  Then turned attention to the dome and resected this tumor.  I then resected the 2 posterior tumors.  There were 2 or 3 other very small early recurrences that were fulgurated.  Then turned attention to the left bladder neck it was about 5:00.  And came right up to the bladder neck and prostatic urethra.  Then the left bladder neck anterior was resected.  This was a  difficult 1 to get to with his anatomy and it was so far anterior and superior.  It did look like a good resection.  It would be important to do a good retroflex and bladder neck inspection on follow-up.  All the resection sites look good.  I was not concerned about perforation.  Hemostasis was excellent.  The scope was removed and a 16 French Foley catheter was drained.  He was awakened taken recovery room in stable condition.  Instillation of gemcitabine in PACU: Gemcitabine 2000 mg was instilled per urethra and left indwelling for 1 hour.  Complications: None  Blood loss: Minimal  Specimens to pathology: #1 right bladder #2 posterior (dome and posterior) #3 bladder neck (left bladder neck and left bladder neck anterior)  Drains: 16 French Foley catheter  Disposition: Patient stable to PACU -I discussed the procedure, postop care and follow-up with Mrs. Ronald Atkins.

## 2022-11-19 NOTE — Anesthesia Procedure Notes (Signed)
Procedure Name: Intubation Date/Time: 11/19/2022 7:46 AM  Performed by: Elisabeth Cara, CRNAPre-anesthesia Checklist: Patient identified, Emergency Drugs available, Suction available, Patient being monitored and Timeout performed Patient Re-evaluated:Patient Re-evaluated prior to induction Oxygen Delivery Method: Circle system utilized Preoxygenation: Pre-oxygenation with 100% oxygen Induction Type: IV induction Ventilation: Mask ventilation without difficulty Laryngoscope Size: Mac and 4 Grade View: Grade I Tube type: Oral Tube size: 7.5 mm Number of attempts: 1 Airway Equipment and Method: Stylet Placement Confirmation: ETT inserted through vocal cords under direct vision, positive ETCO2 and breath sounds checked- equal and bilateral Secured at: 24 cm Tube secured with: Tape Dental Injury: Teeth and Oropharynx as per pre-operative assessment

## 2022-11-20 ENCOUNTER — Encounter (HOSPITAL_COMMUNITY): Payer: Self-pay | Admitting: Urology

## 2022-11-23 LAB — SURGICAL PATHOLOGY

## 2023-03-11 ENCOUNTER — Ambulatory Visit: Payer: BC Managed Care – PPO | Admitting: Diagnostic Neuroimaging

## 2023-04-05 ENCOUNTER — Other Ambulatory Visit: Payer: Self-pay | Admitting: Nurse Practitioner

## 2023-04-05 DIAGNOSIS — J449 Chronic obstructive pulmonary disease, unspecified: Secondary | ICD-10-CM

## 2023-04-05 DIAGNOSIS — J439 Emphysema, unspecified: Secondary | ICD-10-CM

## 2023-06-10 ENCOUNTER — Ambulatory Visit: Payer: BC Managed Care – PPO | Admitting: Diagnostic Neuroimaging

## 2023-06-10 ENCOUNTER — Encounter: Payer: Self-pay | Admitting: Diagnostic Neuroimaging

## 2023-06-10 VITALS — BP 129/75 | HR 78 | Ht 69.0 in | Wt 217.8 lb

## 2023-06-10 DIAGNOSIS — G250 Essential tremor: Secondary | ICD-10-CM | POA: Diagnosis not present

## 2023-06-10 MED ORDER — PRIMIDONE 50 MG PO TABS: 25.0000 mg | ORAL_TABLET | Freq: Two times a day (BID) | ORAL | 3 refills | Status: DC | PRN

## 2023-06-10 NOTE — Progress Notes (Signed)
[facility]  PATIENT: Ronald Atkins DOB: 03-Dec-1955  REFERRING CLINICIAN: Lewis Moccasin, MD HISTORY FROM: patient  REASON FOR VISIT: new consult   HISTORICAL  CHIEF COMPLAINT:  Chief Complaint  Patient presents with   New Patient (Initial Visit)    Rm 6, alone, Tremors.  Going on over for a year, bilateral hand tremors (sporadic).      HISTORY OF PRESENT ILLNESS:   67 year old right handed male here for evaluation of tremor.  Symptoms started about 4 to 5 years ago with tremor in the right greater than left side.  In last 1 year her handwriting has worsened.  No family history of tremor.  No speech or swallowing difficulty.  No balance or walking issues.   REVIEW OF SYSTEMS: Full 14 system review of systems performed and negative with exception of: as per HPI.  ALLERGIES: Allergies  Allergen Reactions   Oxycodone-Acetaminophen Itching    Not allergic to tylenol    HOME MEDICATIONS: Outpatient Medications Prior to Visit  Medication Sig Dispense Refill   AIRSUPRA 90-80 MCG/ACT AERO Inhale 2 puffs into the lungs every 4 (four) hours as needed.     Azelastine HCl 137 MCG/SPRAY SOLN Place 2 sprays into both nostrils 2 (two) times daily.     Multiple Vitamin (MULTIVITAMIN) tablet Take 1 tablet by mouth daily.     olmesartan-hydrochlorothiazide (BENICAR HCT) 40-12.5 MG tablet Take 0.5 tablets by mouth daily.     rosuvastatin (CRESTOR) 5 MG tablet Take 5 mg by mouth daily.     tamsulosin (FLOMAX) 0.4 MG CAPS capsule Take 0.4 mg by mouth daily.     TRELEGY ELLIPTA 200-62.5-25 MCG/ACT AEPB Take 1 puff by mouth daily.     BREZTRI AEROSPHERE 160-9-4.8 MCG/ACT AERO Inhale 2 puffs into the lungs daily.     No facility-administered medications prior to visit.    PAST MEDICAL HISTORY: Past Medical History:  Diagnosis Date   Anxiety    Arthritis    hands   Bladder cancer [facility])    urologist-- dr eskridge   Bladder neoplasm    COPD (chronic  obstructive pulmonary disease) (HCC)    DDD (degenerative disc disease), cervical    cervical disc ruptured and compression V4-V7   Diverticulosis of colon    History of acute prostatitis    10-16-2014--  RESOLVED   History of head injury 1981   Military accident   History of left shoulder fracture 1981   Military accident   History of migraine    from neck pain   History of rib fracture 1981   Military accident   Lump in neck    Left side   Lung nodule 11/27/2014   3mm subpleural nodule left lung base   Pneumonia    remote history of walking pneumonia   Tinnitus     PAST SURGICAL HISTORY: Past Surgical History:  Procedure Laterality Date   COLONOSCOPY     CYSTOSCOPY WITH BIOPSY N/A 08/20/2016   Procedure: CYSTOSCOPY WITH BIOPSY AND FULGURATION INSTILL EPIRUBICIN;  Surgeon: Jerilee Field, MD;  Location: [facility];  Service: Urology;  Laterality: N/A;   CYSTOSCOPY WITH BIOPSY N/A 04/24/2019   Procedure: CYSTOSCOPY WITH BLADDER BIOPSY 0.5-2CM, FULGURATION/ EPIRUBICIN INSTILLATION/ BILATERAL RETROGRADE PYELOGRAM;  Surgeon: Jerilee Field, MD;  Location: [facility];  Service: Urology;  Laterality: N/A;   CYSTOSCOPY WITH FULGERATION N/A 11/03/2018   Procedure: CYSTOSCOPY WITH FULGERATION/ BLADDER BIOPSY/INSTILLATION OF GEMCITABINE;  Surgeon: Jerilee Field, MD;  Location: [facility];  Service: Urology;  Laterality: N/A;   FRACTURE SURGERY  age 83 (approx)   military service injury   HAND SURGERY  [phone]   Left small finger after crushing accident   TRANSURETHRAL RESECTION OF BLADDER TUMOR N/A 02/11/2015   Procedure: TRANSURETHRAL RESECTION OF BLADDER TUMOR (TURBT) ;  Surgeon: Jerilee Field, MD;  Location[facility];  Service: Urology;  Laterality: N/A;   TRANSURETHRAL RESECTION OF BLADDER TUMOR N/A 03/10/2018   Procedure: TRANSURETHRAL RESECTION OF BLADDER TUMOR (TURBT)/ CYSTOSCOPY/WITH BILATERAL RETROGRADES, POST OPERATIVE INSTILLATION OF  CHEMOTHERAPY IN PACU;  Surgeon: Jerilee Field, MD;  Location: [facility];  Service: Urology;  Laterality: N/A;   TRANSURETHRAL RESECTION OF BLADDER TUMOR WITH MITOMYCIN-C N/A 11/19/2022   Procedure: TRANSURETHRAL RESECTION OF BLADDER TUMOR;  Surgeon: Jerilee Field, MD;  Location: [facility];  Service: Urology;  Laterality: N/A;  75 MINS FOR CASE    FAMILY HISTORY: Family History  Problem Relation Age of Onset   Heart disease Mother     SOCIAL HISTORY: Social History   Socioeconomic History   Marital status: Married    Spouse name: Not on file   Number of children: 2   Years of education: Not on file   Highest education level: Not on file  Occupational History   Not on file  Tobacco Use   Smoking status: Some Days    Current packs/day: 0.00    Average packs/day: 0.5 packs/day for 40.0 years (20.0 ttl pk-yrs)    Types: Cigarettes    Start date: 02/03/1974    Last attempt to quit: 02/03/2014    Years since quitting: 9.3   Smokeless tobacco: Never   Tobacco comments:    4-5 per month  Vaping Use   Vaping status: Former   Start date: 10/26/2013   Quit date: 03/28/2014   Devices: used to quit smoking cigaretts  Substance and Sexual Activity   Alcohol use: Yes    Alcohol/week: 1.0 standard drink of alcohol    Types: 1 Standard drinks or equivalent per week    Comment: occ beer   Drug use: No   Sexual activity: Not Currently  Other Topics Concern   Not on file  Social History Narrative   Caffiene expresso (12 oz daily)   drink like redbull (NOS)   Work:  Web designer   Social Drivers of Corporate investment banker Strain: Not on file  Food Insecurity: Not on file  Transportation Needs: Not on file  Physical Activity: Not on file  Stress: Not on file  Social Connections: Unknown (04/27/2022)   [facility] from [facility]   Social Network    Social Network: Not on file  Intimate Partner Violence: Unknown (04/27/2022)   Received  from [employer], [facility]   HITS    Physically Hurt: Not on file    Insult or Talk Down To: Not on file    Threaten Physical Harm: Not on file    Scream or Curse: Not on file     PHYSICAL EXAM  GENERAL EXAM/CONSTITUTIONAL: Vitals:  Vitals:   06/10/23 1014  BP: 129/75  Pulse: 78  Weight: 217 lb 12.8 oz (98.8 kg)  Height: 5\' 9"  (1.753 m)   Body mass index is 32.16 kg/m. Wt Readings from Last 3 Encounters:  06/10/23 217 lb 12.8 oz (98.8 kg)  11/19/22 203 lb (92.1 kg)  11/12/22 203 lb (92.1 kg)   Patient is in no distress; well developed, nourished and groomed;  neck is supple  CARDIOVASCULAR: Examination of carotid arteries is normal; no carotid bruits Regular rate and rhythm, no murmurs Examination of peripheral vascular system by observation and palpation is normal  EYES: Ophthalmoscopic exam of optic discs and posterior segments is normal; no papilledema or hemorrhages No results found.  MUSCULOSKELETAL: Gait, strength, tone, movements noted in Neurologic exam below  NEUROLOGIC: MENTAL STATUS:      No data to display         awake, alert, oriented to person, place and time recent and remote memory intact normal attention and concentration language fluent, comprehension intact, naming intact fund of knowledge appropriate  CRANIAL NERVE:  2nd - no papilledema on fundoscopic exam 2nd, 3rd, 4th, 6th - pupils equal and reactive to light, visual fields full to confrontation, extraocular muscles intact, no nystagmus 5th - facial sensation symmetric 7th - facial strength symmetric 8th - hearing intact 9th - palate elevates symmetrically, uvula midline 11th - shoulder shrug symmetric 12th - tongue protrusion midline  MOTOR:  normal bulk and tone, full strength in the BUE, BLE MILD POSTURAL AND ACTION TREMOR IN BUE; SLIGHT HEAD TREMOR NO RIGIDTY; NO BRADYKINESIA  SENSORY:  normal and symmetric to light touch, temperature, vibration  COORDINATION:   finger-nose-finger, fine finger movements normal  REFLEXES:  deep tendon reflexes 1+ and symmetric  GAIT/STATION:  narrow based gait; GOOD ARM SWING; able to walk on toes, heels and tandem; romberg is negative     DIAGNOSTIC DATA (LABS, IMAGING, TESTING) - I reviewed patient records, labs, notes, testing and imaging myself where available.  Lab Results  Component Value Date   WBC 9.3 11/12/2022   HGB 16.1 11/12/2022   HCT 47.4 11/12/2022   MCV 91.0 11/12/2022   PLT 219 11/12/2022      Component Value Date/Time   NA 137 11/12/2022 1143   K 3.9 11/12/2022 1143   CL 103 11/12/2022 1143   CO2 26 11/12/2022 1143   GLUCOSE 101 (H) 11/12/2022 1143   BUN 16 11/12/2022 1143   CREATININE 0.86 11/12/2022 1143   CREATININE 0.85 09/30/2014 1434   CALCIUM 9.3 11/12/2022 1143   PROT 7.6 09/30/2014 1434   ALBUMIN 4.4 09/30/2014 1434   AST 21 09/30/2014 1434   ALT 25 09/30/2014 1434   ALKPHOS 64 09/30/2014 1434   BILITOT 0.4 09/30/2014 1434   GFRNONAA >60 11/12/2022 1143   No results found for: "CHOL", "HDL", "LDLCALC", "LDLDIRECT", "TRIG", "CHOLHDL" No results found for: "HGBA1C" No results found for: "VITAMINB12" No results found for: "TSH"    ASSESSMENT AND PLAN  67 y.o. year old male here with:   Dx:  1. Essential tremor     PLAN:  ESSENTIAL TREMOR (since ~2020) - consider low dose primidone 25-50mg  as needed for fine motor task situations (caution with driving) - consider stabilizing device  Meds ordered this encounter  Medications   primidone (MYSOLINE) 50 MG tablet    Sig: Take 0.5-1 tablets (25-50 mg total) by mouth 2 (two) times daily as needed.    Dispense:  60 tablet    Refill:  3   Return for pending if symptoms worsen or fail to improve, pending test results.    Suanne Marker, MD 06/10/2023, 11:16 AM Certified in Neurology, Neurophysiology and Neuroimaging  [facility] [address] [place], [place]  [address] [phone]

## 2023-06-10 NOTE — Patient Instructions (Signed)
ESSENTIAL TREMOR  - consider low dose primidone 25-50mg  as needed for fine motor task situations (caution with driving) - consider stabilizing device (utensil)

## 2023-08-12 ENCOUNTER — Other Ambulatory Visit: Payer: Self-pay | Admitting: Nurse Practitioner

## 2023-08-12 DIAGNOSIS — J449 Chronic obstructive pulmonary disease, unspecified: Secondary | ICD-10-CM

## 2023-08-12 DIAGNOSIS — J439 Emphysema, unspecified: Secondary | ICD-10-CM

## 2023-09-03 ENCOUNTER — Other Ambulatory Visit: Payer: Self-pay | Admitting: Diagnostic Neuroimaging

## 2023-09-05 NOTE — Telephone Encounter (Signed)
 Last seen on 06/10/23 No follow up scheduled

## 2023-11-14 ENCOUNTER — Other Ambulatory Visit: Payer: Self-pay | Admitting: Nurse Practitioner

## 2023-11-14 ENCOUNTER — Ambulatory Visit
Admission: RE | Admit: 2023-11-14 | Discharge: 2023-11-14 | Disposition: A | Discharge status: Home / Self Care | Payer: Worker's Compensation | Source: Ambulatory Visit | Attending: Nurse Practitioner | Admitting: Nurse Practitioner

## 2023-11-14 DIAGNOSIS — M545 Low back pain, unspecified: Secondary | ICD-10-CM

## 2023-11-18 ENCOUNTER — Emergency Department (HOSPITAL_COMMUNITY)
Admission: EM | Admit: 2023-11-18 | Discharge: 2023-11-18 | Disposition: A | Discharge status: Home / Self Care | Attending: Emergency Medicine | Admitting: Emergency Medicine

## 2023-11-18 ENCOUNTER — Emergency Department (HOSPITAL_COMMUNITY)

## 2023-11-18 ENCOUNTER — Other Ambulatory Visit: Payer: Self-pay

## 2023-11-18 DIAGNOSIS — Z8551 Personal history of malignant neoplasm of bladder: Secondary | ICD-10-CM | POA: Diagnosis not present

## 2023-11-18 DIAGNOSIS — M5442 Lumbago with sciatica, left side: Secondary | ICD-10-CM | POA: Diagnosis not present

## 2023-11-18 DIAGNOSIS — M545 Low back pain, unspecified: Secondary | ICD-10-CM | POA: Diagnosis present

## 2023-11-18 DIAGNOSIS — J449 Chronic obstructive pulmonary disease, unspecified: Secondary | ICD-10-CM | POA: Diagnosis not present

## 2023-11-18 LAB — BASIC METABOLIC PANEL WITH GFR
Anion gap: 10 (ref 5–15)
BUN: 16 mg/dL (ref 8–23)
CO2: 22 mmol/L (ref 22–32)
Calcium: 9.1 mg/dL (ref 8.9–10.3)
Chloride: 106 mmol/L (ref 98–111)
Creatinine, Ser: 0.94 mg/dL (ref 0.61–1.24)
GFR, Estimated: >60 mL/min (ref >60)
Glucose, Bld: 92 mg/dL (ref 70–99)
Potassium: 4.2 mmol/L (ref 3.5–5.1)
Sodium: 138 mmol/L (ref 135–145)

## 2023-11-18 LAB — CBC WITH DIFFERENTIAL/PLATELET
Abs Immature Granulocytes: 0.07 10*3/uL (ref 0.00–0.07)
Basophils Absolute: 0.1 10*3/uL (ref 0.0–0.1)
Basophils Relative: 1 %
Eosinophils Absolute: 0.4 10*3/uL (ref 0.0–0.5)
Eosinophils Relative: 3 %
HCT: 45.8 % (ref 39.0–52.0)
Hemoglobin: 16 g/dL (ref 13.0–17.0)
Immature Granulocytes: 1 %
Lymphocytes Relative: 34 %
Lymphs Abs: 4 10*3/uL (ref 0.7–4.0)
MCH: 31.3 pg (ref 26.0–34.0)
MCHC: 34.9 g/dL (ref 30.0–36.0)
MCV: 89.5 fL (ref 80.0–100.0)
Monocytes Absolute: 0.9 10*3/uL (ref 0.1–1.0)
Monocytes Relative: 8 %
Neutro Abs: 6.1 10*3/uL (ref 1.7–7.7)
Neutrophils Relative %: 53 %
Platelets: 210 10*3/uL (ref 150–400)
RBC: 5.12 MIL/uL (ref 4.22–5.81)
RDW: 12.4 % (ref 11.5–15.5)
WBC: 11.5 10*3/uL — ABNORMAL HIGH (ref 4.0–10.5)
nRBC: 0 % (ref 0.0–0.2)

## 2023-11-18 MED ORDER — KETOROLAC TROMETHAMINE 30 MG/ML IJ SOLN
30.0000 mg | Freq: Once | INTRAMUSCULAR | Status: AC
  Administered 2023-11-18: 30 mg via INTRAVENOUS
  Filled 2023-11-18: qty 1

## 2023-11-18 MED ORDER — ONDANSETRON HCL 4 MG/2ML IJ SOLN
4.0000 mg | Freq: Once | INTRAMUSCULAR | Status: AC
  Administered 2023-11-18: 4 mg via INTRAVENOUS
  Filled 2023-11-18: qty 2

## 2023-11-18 MED ORDER — METHOCARBAMOL 500 MG PO TABS: 500.0000 mg | ORAL_TABLET | Freq: Three times a day (TID) | ORAL | 0 refills | Status: DC | PRN

## 2023-11-18 MED ORDER — METHOCARBAMOL 500 MG PO TABS
500.0000 mg | ORAL_TABLET | Freq: Once | ORAL | Status: AC
  Administered 2023-11-18: 500 mg via ORAL
  Filled 2023-11-18: qty 1

## 2023-11-18 MED ORDER — GABAPENTIN 300 MG PO CAPS: 300.0000 mg | ORAL_CAPSULE | Freq: Three times a day (TID) | ORAL | 0 refills | Status: AC

## 2023-11-18 MED ORDER — HYDROMORPHONE HCL 1 MG/ML IJ SOLN
1.0000 mg | Freq: Once | INTRAMUSCULAR | Status: AC
  Administered 2023-11-18: 1 mg via INTRAVENOUS
  Filled 2023-11-18: qty 1

## 2023-11-18 MED ORDER — PREDNISONE 10 MG (21) PO TBPK: ORAL_TABLET | Freq: Every day | ORAL | 0 refills | Status: DC

## 2023-11-18 NOTE — ED Notes (Signed)
 Ortho tech at bedside
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
## 2023-11-18 NOTE — Discharge Instructions (Signed)
You have been seen in the Emergency Department (ED)  today for back pain.  Your workup and exam have not shown any acute abnormalities and you are likely suffering from muscle strain or possible problems with your discs, but there is no treatment that will fix your symptoms at this time.  Please take Motrin (ibuprofen) as needed for your pain according to the instructions written on the box.  Alternatively, for the next five days you can take 600mg  three times daily with meals (it may upset your stomach).  Please follow up with your doctor as soon as possible regarding today's ED visit and your back pain.  Return to the ED for worsening back pain, fever, weakness or numbness of either leg, or if you develop either (1) an inability to urinate or have bowel movements, or (2) loss of your ability to control your bathroom functions (if you start having "accidents"), or if you develop other new symptoms that concern you.

## 2023-11-18 NOTE — ED Triage Notes (Signed)
 Patient arrives for eval of L lower back pain, after injury sustained at work. 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 This initial injury was t2 weeks ago and he went to his occupational health and was given tramadol , a steroid shot, muscle relaxer, and was instructed to take tylenol  and ibuprofen in addition to these. Was feeling well after a week of that so went back to work on Sunday and since then has been in severe pain.

## 2023-12-06 ENCOUNTER — Emergency Department (HOSPITAL_COMMUNITY)
Admission: EM | Admit: 2023-12-06 | Discharge: 2023-12-06 | Disposition: A | Discharge status: Home / Self Care | Payer: Worker's Compensation | Attending: Emergency Medicine | Admitting: Emergency Medicine

## 2023-12-06 ENCOUNTER — Encounter (HOSPITAL_COMMUNITY): Payer: Self-pay

## 2023-12-06 ENCOUNTER — Other Ambulatory Visit: Payer: Self-pay

## 2023-12-06 DIAGNOSIS — M545 Low back pain, unspecified: Secondary | ICD-10-CM | POA: Diagnosis present

## 2023-12-06 DIAGNOSIS — M5442 Lumbago with sciatica, left side: Secondary | ICD-10-CM | POA: Diagnosis not present

## 2023-12-06 MED ORDER — HYDROMORPHONE HCL 1 MG/ML IJ SOLN
1.0000 mg | Freq: Once | INTRAMUSCULAR | Status: AC
  Administered 2023-12-06: 1 mg via INTRAVENOUS
  Filled 2023-12-06: qty 1

## 2023-12-06 MED ORDER — METHOCARBAMOL 500 MG PO TABS
500.0000 mg | ORAL_TABLET | Freq: Once | ORAL | Status: AC
  Administered 2023-12-06: 500 mg via ORAL
  Filled 2023-12-06: qty 1

## 2023-12-06 MED ORDER — METHOCARBAMOL 500 MG PO TABS: 500.0000 mg | ORAL_TABLET | Freq: Three times a day (TID) | ORAL | 0 refills | Status: AC | PRN

## 2023-12-06 MED ORDER — KETOROLAC TROMETHAMINE 30 MG/ML IJ SOLN
30.0000 mg | Freq: Once | INTRAMUSCULAR | Status: AC
  Administered 2023-12-06: 30 mg via INTRAVENOUS
  Filled 2023-12-06: qty 1

## 2023-12-06 MED ORDER — HYDROCODONE-ACETAMINOPHEN 5-325 MG PO TABS: 1.0000 | ORAL_TABLET | Freq: Four times a day (QID) | ORAL | 0 refills | Status: AC | PRN

## 2023-12-06 NOTE — ED Triage Notes (Addendum)
 Pt to ED with c/o lower back pain. Pt seen several weeks ago and diagnosed with bulging disk at L4. Pt was doing fine with prescribed tramadol  and ibuprofen. Today, pt is unable to ambulate well and pain is 10/10. Pain radiating down left leg. Pt given 200mcg fentanyl  en route. Pt A&Ox4. +PMS.

## 2023-12-06 NOTE — ED Provider Notes (Signed)
 [facility] Provider Note   CSN: [id number] Arrival date & time: 12/06/23  1459     History  Chief Complaint  Patient presents with   Back Pain    Ronald Atkins is a 68 y.o. male.   Back Pain Patient with recurrent low back pain.  Has known bulging disc at L4 going down the left leg.  On tramadol  and Lyrica.  Has been seen by neurosurgery with plan of an epidural steroid injection in 2 days.  States pain now out of control.  No fevers.  No new injury.  Had been on steroid pack the end of last month.     Home Medications Prior to Admission medications   Medication Sig Start Date End Date Taking? Authorizing Provider  HYDROcodone -acetaminophen  (NORCO/VICODIN) 5-325 MG tablet Take 1-2 tablets by mouth every 6 (six) hours as needed. 12/06/23  Yes Mozell Arias, MD  AIRSUPRA 90-80 MCG/ACT AERO Inhale 2 puffs into the lungs every 4 (four) hours as needed. 04/01/23   [provider]  Azelastine HCl 137 MCG/SPRAY SOLN Place 2 sprays into both nostrils 2 (two) times daily.    [provider]  gabapentin  (NEURONTIN ) 300 MG capsule Take 1 capsule (300 mg total) by mouth 3 (three) times daily for 20 days. 11/18/23 12/08/23  Long, Shereen Dike, MD  methocarbamol  (ROBAXIN ) 500 MG tablet Take 1 tablet (500 mg total) by mouth every 8 (eight) hours as needed. 12/06/23   Mozell Arias, MD  Multiple Vitamin (MULTIVITAMIN) tablet Take 1 tablet by mouth daily.    [provider]  olmesartan-hydrochlorothiazide (BENICAR HCT) 40-12.5 MG tablet Take 0.5 tablets by mouth daily. 01/16/21   [provider]  predniSONE  (STERAPRED UNI-PAK 21 TAB) 10 MG (21) TBPK tablet Take by mouth daily. Take 6 tabs by mouth daily  for 2 days, then 5 tabs for 2 days, then 4 tabs for 2 days, then 3 tabs for 2 days, 2 tabs for 2 days, then 1 tab by mouth daily for 2 days 11/18/23   Long, Shereen Dike, MD  primidone  (MYSOLINE ) 50 MG tablet TAKE 0.5-1  TABLETS (25-50 MG TOTAL) BY MOUTH 2 (TWO) TIMES DAILY AS NEEDED. 09/05/23   Penumalli, Vikram R, MD  rosuvastatin (CRESTOR) 5 MG tablet Take 5 mg by mouth daily. 10/26/20   [provider]  tamsulosin (FLOMAX) 0.4 MG CAPS capsule Take 0.4 mg by mouth daily. 06/03/23   [provider]  Gaylan Kaufman 200-62.5-25 MCG/ACT AEPB Take 1 puff by mouth daily. 05/10/23   [provider]      Allergies    Oxycodone -acetaminophen     Review of Systems   Review of Systems  Musculoskeletal:  Positive for back pain.    Physical Exam Updated Vital Signs BP (!) 132/92   Pulse 65   Temp 97.8 F (36.6 C) (Oral)   Resp 16   Ht 5\' 9"  (1.753 m)   Wt 98.8 kg   SpO2 99%   BMI 32.17 kg/m  Physical Exam Vitals and nursing note reviewed.  Constitutional:      Comments: In bed appears uncomfortable.  Musculoskeletal:     Comments: Pain with movement of left lower extremity.  Lateral tenderness on the leg.  Sensation grossly intact in foot.  Neurological:     Mental Status: He is alert.     ED Results / Procedures / Treatments   Labs (all labs ordered are listed, but only abnormal results are displayed) Labs  Reviewed - No data to display  EKG None  Radiology No results found.  Procedures Procedures    Medications Ordered in ED Medications  HYDROmorphone  (DILAUDID ) injection 1 mg (1 mg Intravenous Given 12/06/23 1600)  ketorolac  (TORADOL ) 30 MG/ML injection 30 mg (30 mg Intravenous Given 12/06/23 1601)  methocarbamol  (ROBAXIN ) tablet 500 mg (500 mg Oral Given 12/06/23 1637)  HYDROmorphone  (DILAUDID ) injection 1 mg (1 mg Intravenous Given 12/06/23 1746)    ED Course/ Medical Decision Making/ A&P                                 Medical Decision Making Risk Prescription drug management.   Patient with acute on chronic low back pain going down the leg.  Has had previous MRI a couple weeks ago.  Plans for epidural steroid injection.  Pain not controlled at home  however.  Will give Dilaudid  and Toradol  IV.  Will also give oral Robaxin .  Reviewed MRI and reviewed neurology note.  Feeling better after treatment although not great.  Do not think we need to restart steroids.  Due for epidural steroid injection in 2 days.  Will increase pain management hopefully that will help keep pain controlled.  Will discharge home.        Final Clinical Impression(s) / ED Diagnoses Final diagnoses:  Left-sided low back pain with left-sided sciatica, unspecified chronicity    Rx / DC Orders ED Discharge Orders          Ordered    HYDROcodone -acetaminophen  (NORCO/VICODIN) 5-325 MG tablet  Every 6 hours PRN        12/06/23 1737    methocarbamol  (ROBAXIN ) 500 MG tablet  Every 8 hours PRN        12/06/23 1737              Mozell Arias, MD 12/06/23 2237

## 2024-02-13 ENCOUNTER — Other Ambulatory Visit: Payer: Self-pay | Admitting: Urology

## 2024-02-14 MED ORDER — GEMCITABINE CHEMO FOR BLADDER INSTILLATION 2000 MG: 2000.0000 mg | Freq: Once | INTRAVENOUS | Status: AC

## 2024-03-12 ENCOUNTER — Encounter (HOSPITAL_COMMUNITY): Payer: Self-pay | Admitting: Urology

## 2024-03-12 NOTE — Progress Notes (Signed)
 Spoke w/ via phone for pre-op interview--- [facility] needs dos----  BMP and EKG per anesthesia.        Lab results------ COVID test -----patient states asymptomatic no test needed Arrive at -------1000 NPO after MN NO Solid Food.  Clear liquids from MN until---0900 Pre-Surgery Ensure or G2:  Med rec completed Medications to take morning of surgery ----- Airsupra, Robaxin , Primidone , PRN. Trelegy inhaler. Diabetic medication -----  GLP1 agonist last dose: GLP1 instructions:  Patient instructed no nail polish to be worn day of surgery Patient instructed to bring photo id and insurance card day of surgery Patient aware to have Driver (ride ) / caregiver    for 24 hours after surgery - Wife Cathey Molt Patient Special Instructions ----- Pre-Op special Instructions ----- NS pt has Rocephin  ordered.  Patient verbalized understanding of instructions that were given at this phone interview. Patient denies chest pain, sob, fever, cough at the interview.

## 2024-03-15 NOTE — Progress Notes (Signed)
 Patient surgery start time has been charged to 0915.  Called and spoke w/ patient via phone identified w/ name & dob.  Pt verbalized understanding to arrived at 0715 be npo after midnight with exception clear liquids until 0615.  Pt had no further question.

## 2024-03-16 ENCOUNTER — Other Ambulatory Visit: Payer: Self-pay

## 2024-03-16 ENCOUNTER — Ambulatory Visit (HOSPITAL_COMMUNITY): Payer: Self-pay | Admitting: Registered Nurse

## 2024-03-16 ENCOUNTER — Ambulatory Visit (HOSPITAL_COMMUNITY)
Admission: RE | Admit: 2024-03-16 | Discharge: 2024-03-16 | Disposition: A | Discharge status: Home / Self Care | Attending: Urology | Admitting: Urology

## 2024-03-16 ENCOUNTER — Encounter (HOSPITAL_COMMUNITY): Payer: Self-pay | Admitting: Registered Nurse

## 2024-03-16 ENCOUNTER — Encounter (HOSPITAL_COMMUNITY)
Admission: RE | Disposition: A | Discharge status: Home / Self Care | Payer: Self-pay | Source: Home / Self Care | Attending: Urology

## 2024-03-16 ENCOUNTER — Encounter (HOSPITAL_COMMUNITY): Payer: Self-pay | Admitting: Urology

## 2024-03-16 DIAGNOSIS — J449 Chronic obstructive pulmonary disease, unspecified: Secondary | ICD-10-CM | POA: Insufficient documentation

## 2024-03-16 DIAGNOSIS — Z79899 Other long term (current) drug therapy: Secondary | ICD-10-CM | POA: Insufficient documentation

## 2024-03-16 DIAGNOSIS — I10 Essential (primary) hypertension: Secondary | ICD-10-CM | POA: Insufficient documentation

## 2024-03-16 DIAGNOSIS — C672 Malignant neoplasm of lateral wall of bladder: Secondary | ICD-10-CM | POA: Insufficient documentation

## 2024-03-16 DIAGNOSIS — D414 Neoplasm of uncertain behavior of bladder: Secondary | ICD-10-CM

## 2024-03-16 HISTORY — DX: Essential (primary) hypertension: I10

## 2024-03-16 LAB — BASIC METABOLIC PANEL WITH GFR
Anion gap: 11 (ref 5–15)
BUN: 18 mg/dL (ref 8–23)
CO2: 20 mmol/L — ABNORMAL LOW (ref 22–32)
Calcium: 9.2 mg/dL (ref 8.9–10.3)
Chloride: 108 mmol/L (ref 98–111)
Creatinine, Ser: 0.89 mg/dL (ref 0.61–1.24)
GFR, Estimated: >60 mL/min (ref >60)
Glucose, Bld: 105 mg/dL — ABNORMAL HIGH (ref 70–99)
Potassium: 4 mmol/L (ref 3.5–5.1)
Sodium: 139 mmol/L (ref 135–145)

## 2024-03-16 SURGERY — TURBT (TRANSURETHRAL RESECTION OF BLADDER TUMOR)
Anesthesia: General

## 2024-03-16 MED ORDER — CHLORHEXIDINE GLUCONATE 0.12 % MT SOLN
15.0000 mL | Freq: Once | OROMUCOSAL | Status: AC
  Administered 2024-03-16: 15 mL via OROMUCOSAL

## 2024-03-16 MED ORDER — DROPERIDOL 2.5 MG/ML IJ SOLN: 0.6250 mg | Freq: Once | INTRAMUSCULAR | Status: DC | PRN

## 2024-03-16 MED ORDER — CHLORHEXIDINE GLUCONATE 0.12 % MT SOLN
OROMUCOSAL | Status: AC
  Filled 2024-03-16: qty 15

## 2024-03-16 MED ORDER — ONDANSETRON HCL 4 MG/2ML IJ SOLN
INTRAMUSCULAR | Status: DC | PRN
  Administered 2024-03-16: 4 mg via INTRAVENOUS

## 2024-03-16 MED ORDER — PROPOFOL 10 MG/ML IV BOLUS
INTRAVENOUS | Status: AC
Start: 2024-03-16 — End: 2024-03-16
  Filled 2024-03-16: qty 20

## 2024-03-16 MED ORDER — LIDOCAINE 2% (20 MG/ML) 5 ML SYRINGE
INTRAMUSCULAR | Status: DC | PRN
  Administered 2024-03-16: 80 mg via INTRAVENOUS

## 2024-03-16 MED ORDER — SODIUM CHLORIDE 0.9 % IV SOLN: INTRAVENOUS | Status: DC

## 2024-03-16 MED ORDER — FENTANYL CITRATE (PF) 250 MCG/5ML IJ SOLN
INTRAMUSCULAR | Status: DC | PRN
  Administered 2024-03-16: 100 ug via INTRAVENOUS
  Administered 2024-03-16: 50 ug via INTRAVENOUS

## 2024-03-16 MED ORDER — PHENYLEPHRINE 80 MCG/ML (10ML) SYRINGE FOR IV PUSH (FOR BLOOD PRESSURE SUPPORT)
PREFILLED_SYRINGE | INTRAVENOUS | Status: AC
  Filled 2024-03-16: qty 10

## 2024-03-16 MED ORDER — LIDOCAINE 2% (20 MG/ML) 5 ML SYRINGE
INTRAMUSCULAR | Status: AC
  Filled 2024-03-16: qty 5

## 2024-03-16 MED ORDER — ROCURONIUM BROMIDE 10 MG/ML (PF) SYRINGE
PREFILLED_SYRINGE | INTRAVENOUS | Status: DC | PRN
  Administered 2024-03-16: 60 mg via INTRAVENOUS

## 2024-03-16 MED ORDER — SODIUM CHLORIDE 0.9 % IV SOLN
2.0000 g | INTRAVENOUS | Status: AC
Start: 2024-03-16 — End: 2024-03-16
  Administered 2024-03-16: 2 g via INTRAVENOUS
  Filled 2024-03-16 (×2): qty 20

## 2024-03-16 MED ORDER — PHENYLEPHRINE 80 MCG/ML (10ML) SYRINGE FOR IV PUSH (FOR BLOOD PRESSURE SUPPORT)
PREFILLED_SYRINGE | INTRAVENOUS | Status: DC | PRN
  Administered 2024-03-16: 80 ug via INTRAVENOUS

## 2024-03-16 MED ORDER — DEXAMETHASONE SODIUM PHOSPHATE 10 MG/ML IJ SOLN
INTRAMUSCULAR | Status: DC | PRN
  Administered 2024-03-16: 10 mg via INTRAVENOUS

## 2024-03-16 MED ORDER — ACETAMINOPHEN 500 MG PO TABS
ORAL_TABLET | ORAL | Status: AC
  Filled 2024-03-16: qty 2

## 2024-03-16 MED ORDER — SODIUM CHLORIDE 0.9 % IR SOLN
Status: DC | PRN
  Administered 2024-03-16: 3000 mL via INTRAVESICAL

## 2024-03-16 MED ORDER — MIDAZOLAM HCL 2 MG/2ML IJ SOLN
INTRAMUSCULAR | Status: DC | PRN
  Administered 2024-03-16: 2 mg via INTRAVENOUS

## 2024-03-16 MED ORDER — ORAL CARE MOUTH RINSE: 15.0000 mL | Freq: Once | OROMUCOSAL | Status: AC

## 2024-03-16 MED ORDER — MIDAZOLAM HCL 2 MG/2ML IJ SOLN
INTRAMUSCULAR | Status: AC
  Filled 2024-03-16: qty 2

## 2024-03-16 MED ORDER — LACTATED RINGERS IV SOLN: INTRAVENOUS | Status: DC

## 2024-03-16 MED ORDER — GEMCITABINE CHEMO FOR BLADDER INSTILLATION 2000 MG
2000.0000 mg | Freq: Once | INTRAVENOUS | Status: AC
  Administered 2024-03-16: 2000 mg via INTRAVESICAL
  Filled 2024-03-16: qty 52.6

## 2024-03-16 MED ORDER — SUGAMMADEX SODIUM 200 MG/2ML IV SOLN
INTRAVENOUS | Status: DC | PRN
  Administered 2024-03-16: 200 mg via INTRAVENOUS

## 2024-03-16 MED ORDER — FENTANYL CITRATE (PF) 100 MCG/2ML IJ SOLN: 25.0000 ug | INTRAMUSCULAR | Status: DC | PRN

## 2024-03-16 MED ORDER — ONDANSETRON HCL 4 MG/2ML IJ SOLN
INTRAMUSCULAR | Status: AC
  Filled 2024-03-16: qty 2

## 2024-03-16 MED ORDER — FENTANYL CITRATE (PF) 250 MCG/5ML IJ SOLN
INTRAMUSCULAR | Status: AC
  Filled 2024-03-16: qty 5

## 2024-03-16 MED ORDER — ACETAMINOPHEN 500 MG PO TABS
1000.0000 mg | ORAL_TABLET | Freq: Once | ORAL | Status: AC
  Administered 2024-03-16: 1000 mg via ORAL

## 2024-03-16 MED ORDER — ROCURONIUM BROMIDE 10 MG/ML (PF) SYRINGE
PREFILLED_SYRINGE | INTRAVENOUS | Status: AC
  Filled 2024-03-16: qty 10

## 2024-03-16 MED ORDER — PROPOFOL 10 MG/ML IV BOLUS
INTRAVENOUS | Status: DC | PRN
  Administered 2024-03-16: 170 mg via INTRAVENOUS

## 2024-03-16 SURGICAL SUPPLY — 30 items
BAG COUNTER SPONGE SURGICOUNT (BAG) ×4 IMPLANT
BAG DRAIN URO-CYSTO SKYTR STRL (DRAIN) ×2 IMPLANT
BAG URINE DRAIN 2000ML AR STRL (UROLOGICAL SUPPLIES) ×1 IMPLANT
BLADE SURG 15 STRL LF DISP TIS (BLADE) ×2 IMPLANT
CATH FOLEY 3WAY 30CC 22FR (CATHETERS) ×2 IMPLANT
CATH HEMA 3WAY 30CC 22FR COUDE (CATHETERS) ×2 IMPLANT
CATH HEMA 3WAY 30CC 24FR COUDE (CATHETERS) ×2 IMPLANT
COVER SURGICAL LIGHT HANDLE (MISCELLANEOUS) ×2 IMPLANT
ELECTRODE REM PT RTRN 9FT ADLT (ELECTROSURGICAL) ×2 IMPLANT
GLOVE BIOGEL M STRL SZ7.5 (GLOVE) ×2 IMPLANT
GOWN STRL REUS W/ TWL XL LVL3 (GOWN DISPOSABLE) ×2 IMPLANT
HOLDER FOLEY CATH W/STRAP (MISCELLANEOUS) ×2 IMPLANT
KIT SUPRAPUBIC CATH (MISCELLANEOUS) ×2 IMPLANT
KIT TURNOVER KIT B (KITS) ×4 IMPLANT
LOOP CUT BIPOLAR 24F LRG (ELECTROSURGICAL) ×2 IMPLANT
MANIFOLD NEPTUNE II (INSTRUMENTS) ×2 IMPLANT
NS IRRIG 1000ML POUR BTL (IV SOLUTION) ×2 IMPLANT
PACK CYSTO (CUSTOM PROCEDURE TRAY) ×2 IMPLANT
PAD ARMBOARD POSITIONER FOAM (MISCELLANEOUS) ×4 IMPLANT
SET IRRIG Y TYPE TUR BLADDER L (SET/KITS/TRAYS/PACK) ×2 IMPLANT
SOL .9 NS 3000ML IRR UROMATIC (IV SOLUTION) IMPLANT
SOL PREP POV-IOD 4OZ 10% (MISCELLANEOUS) ×2 IMPLANT
SUT ETHILON 3 0 PS 1 (SUTURE) ×2 IMPLANT
SYR 30ML LL (SYRINGE) ×2 IMPLANT
SYRINGE IRR TOOMEY STRL 70CC (SYRINGE) ×2 IMPLANT
TRAY FOL W/BAG SLVR 16FR STRL (SET/KITS/TRAYS/PACK) ×1 IMPLANT
TUBE CONNECTING 12X1/4 (SUCTIONS) ×2 IMPLANT
TUBING ASPIRATION INDIGO (MISCELLANEOUS) ×2 IMPLANT
WATER STERILE IRR 1000ML POUR (IV SOLUTION) ×2 IMPLANT
WATER STERILE IRR 3000ML UROMA (IV SOLUTION) IMPLANT

## 2024-03-16 NOTE — Discharge Instructions (Signed)
  Post Anesthesia Home Care Instructions  Activity: Get plenty of rest for the remainder of the day. A responsible individual must stay with you for 24 hours following the procedure.  For the next 24 hours, DO NOT: -Drive a car -Advertising copywriter -Drink alcoholic beverages -Take any medication unless instructed by your physician -Make any legal decisions or sign important papers.  Meals: Start with liquid foods such as gelatin or soup. Progress to regular foods as tolerated. Avoid greasy, spicy, heavy foods. If nausea and/or vomiting occur, drink only clear liquids until the nausea and/or vomiting subsides. Call your physician if vomiting continues.  Special Instructions/Symptoms: Your throat may feel dry or sore from the anesthesia or the breathing tube placed in your throat during surgery. If this causes discomfort, gargle with warm salt water . The discomfort should disappear within 24 hours.  If you had a scopolamine patch placed behind your ear for the management of post- operative nausea and/or vomiting:  1. The medication in the patch is effective for 72 hours, after which it should be removed.  Wrap patch in a tissue and discard in the trash. Wash hands thoroughly with soap and water . 2. You may remove the patch earlier than 72 hours if you experience unpleasant side effects which may include dry mouth, dizziness or visual disturbances. 3. Avoid touching the patch. Wash your hands with soap and water  after contact with the patch.   No acetaminophen /Tylenol  until after 218 pm today if needed.

## 2024-03-16 NOTE — Anesthesia Preprocedure Evaluation (Addendum)
 Anesthesia Evaluation  Patient identified by MRN, date of birth, ID band Patient awake    Reviewed: Allergy & Precautions, NPO status , Patient's Chart, lab work & pertinent test results  Airway Mallampati: II  TM Distance: >3 FB Neck ROM: Full    Dental no notable dental hx.    Pulmonary COPD, Current Smoker, former smoker   Pulmonary exam normal        Cardiovascular hypertension, Pt. on medications  Rhythm:Regular Rate:Normal     Neuro/Psych   Anxiety     negative neurological ROS     GI/Hepatic negative GI ROS, Neg liver ROS,,,  Endo/Other  negative endocrine ROS    Renal/GU negative Renal ROS  negative genitourinary   Musculoskeletal  (+) Arthritis , Osteoarthritis,    Abdominal Normal abdominal exam  (+)   Peds  Hematology Lab Results      Component                Value               Date                      WBC                      11.5 (H)            11/18/2023                HGB                      16.0                11/18/2023                HCT                      45.8                11/18/2023                MCV                      89.5                11/18/2023                PLT                      210                 11/18/2023             Lab Results      Component                Value               Date                      NA                       138                 11/18/2023                K  4.2                 11/18/2023                CO2                      22                  11/18/2023                GLUCOSE                  92                  11/18/2023                BUN                      16                  11/18/2023                CREATININE               0.94                11/18/2023                CALCIUM                  9.1                 11/18/2023                GFRNONAA                 >60                 11/18/2023               Anesthesia Other Findings   Reproductive/Obstetrics                              Anesthesia Physical Anesthesia Plan  ASA: 3  Anesthesia Plan: General   Post-op Pain Management: Celebrex PO (pre-op)*   Induction: Intravenous  PONV Risk Score and Plan: 1 and Ondansetron , Dexamethasone , Midazolam  and Treatment may vary due to age or medical condition  Airway Management Planned: Mask and Oral ETT  Additional Equipment: None  Intra-op Plan:   Post-operative Plan: Extubation in OR  Informed Consent: I have reviewed the patients History and Physical, chart, labs and discussed the procedure including the risks, benefits and alternatives for the proposed anesthesia with the patient or authorized representative who has indicated his/her understanding and acceptance.     Dental advisory given  Plan Discussed with: CRNA  Anesthesia Plan Comments:          Anesthesia Quick Evaluation

## 2024-03-16 NOTE — H&P (Signed)
 H&P  Chief Complaint: History of bladder cancer  History of Present Illness: Ronald Atkins is a 68 year old male with a history of recurrent low-grade TA bladder cancer but had his first high-grade TA recurrence in 2024.  He  is undergoing BCG.  His August 2025 cystoscopy in the office revealed a right lateral wall tumor.  He underwent a staging CT which was benign in September 2025.  He presents today for TURBT with postop gemcitabine  instillation.  He is had no fever, dysuria or gross hematuria.  No cough cold or congestion.  Past Medical History:  Diagnosis Date   Anxiety    Arthritis    hands   Bladder cancer [facility])    urologist-- dr Kessler Kopinski   Bladder neoplasm    COPD (chronic obstructive pulmonary disease) (HCC)    DDD (degenerative disc disease), cervical    cervical disc ruptured and compression V4-V7   Diverticulosis of colon    History of acute prostatitis    10-16-2014--  RESOLVED   History of head injury 1981   Military accident   History of left shoulder fracture 1981   Military accident   History of migraine    from neck pain   History of rib fracture 1981   Military accident   Hypertension    Lump in neck    Left side   Lung nodule 11/27/2014   3mm subpleural nodule left lung base   Pneumonia    remote history of walking pneumonia   Tinnitus    Past Surgical History:  Procedure Laterality Date   COLONOSCOPY     CYSTOSCOPY WITH BIOPSY N/A 08/20/2016   Procedure: CYSTOSCOPY WITH BIOPSY AND FULGURATION INSTILL EPIRUBICIN ;  Surgeon: Donnice Brooks, MD;  Location: [facility];  Service: Urology;  Laterality: N/A;   CYSTOSCOPY WITH BIOPSY N/A 04/24/2019   Procedure: CYSTOSCOPY WITH BLADDER BIOPSY 0.5-2CM, FULGURATION/ EPIRUBICIN  INSTILLATION/ BILATERAL RETROGRADE PYELOGRAM;  Surgeon: Brooks Donnice, MD;  [facility];  Service: Urology;  Laterality: N/A;   CYSTOSCOPY WITH FULGERATION N/A 11/03/2018   Procedure: CYSTOSCOPY WITH FULGERATION/ BLADDER  BIOPSY/INSTILLATION OF GEMCITABINE ;  Surgeon: Brooks Donnice, MD;  [facility];  Service: Urology;  Laterality: N/A;   FRACTURE SURGERY  age 64 (approx)   military service injury   HAND SURGERY  [phone]   Left small finger after crushing accident   TRANSURETHRAL RESECTION OF BLADDER TUMOR N/A 02/11/2015   Procedure: TRANSURETHRAL RESECTION OF BLADDER TUMOR (TURBT) ;  Surgeon: Donnice Brooks, MD;  Location[facility];  Service: Urology;  Laterality: N/A;   TRANSURETHRAL RESECTION OF BLADDER TUMOR N/A 03/10/2018   Procedure: TRANSURETHRAL RESECTION OF BLADDER TUMOR (TURBT)/ CYSTOSCOPY/WITH BILATERAL RETROGRADES, POST OPERATIVE INSTILLATION OF CHEMOTHERAPY IN PACU;  Surgeon: Brooks Donnice, MD;  Location: [facility];  Service: Urology;  Laterality: N/A;   TRANSURETHRAL RESECTION OF BLADDER TUMOR WITH MITOMYCIN -C N/A 11/19/2022   Procedure: TRANSURETHRAL RESECTION OF BLADDER TUMOR;  Surgeon: Brooks Donnice, MD;  [facility];  Service: Urology;  Laterality: N/A;  75 MINS FOR CASE    Home Medications:  Medications Prior to Admission  Medication Sig Dispense Refill Last Dose/Taking   Azelastine HCl 137 MCG/SPRAY SOLN Place 2 sprays into both nostrils 2 (two) times daily.   Past Week   methocarbamol  (ROBAXIN ) 500 MG tablet Take 1 tablet (500 mg total) by mouth every 8 (eight) hours as needed. 20 tablet 0 Past Week   Multiple Vitamin (MULTIVITAMIN) tablet Take 1 tablet by mouth daily.  03/12/2024   olmesartan-hydrochlorothiazide (BENICAR HCT) 40-12.5 MG tablet Take 0.5 tablets by mouth daily.   03/12/2024   primidone  (MYSOLINE ) 50 MG tablet TAKE 0.5-1 TABLETS (25-50 MG TOTAL) BY MOUTH 2 (TWO) TIMES DAILY AS NEEDED. 180 tablet 1 Past Week   rosuvastatin (CRESTOR) 5 MG tablet Take 5 mg by mouth daily.   03/12/2024   TRELEGY ELLIPTA 200-62.5-25 MCG/ACT AEPB Take 1 puff by mouth daily.   03/12/2024   AIRSUPRA 90-80 MCG/ACT AERO Inhale 2 puffs into the lungs  every 4 (four) hours as needed.   Unknown   gabapentin  (NEURONTIN ) 300 MG capsule Take 1 capsule (300 mg total) by mouth 3 (three) times daily for 20 days. 60 capsule 0    HYDROcodone -acetaminophen  (NORCO/VICODIN) 5-325 MG tablet Take 1-2 tablets by mouth every 6 (six) hours as needed. (Patient not taking: Reported on 03/12/2024) 10 tablet 0 Not Taking   predniSONE  (STERAPRED UNI-PAK 21 TAB) 10 MG (21) TBPK tablet Take by mouth daily. Take 6 tabs by mouth daily  for 2 days, then 5 tabs for 2 days, then 4 tabs for 2 days, then 3 tabs for 2 days, 2 tabs for 2 days, then 1 tab by mouth daily for 2 days 42 tablet 0    tamsulosin (FLOMAX) 0.4 MG CAPS capsule Take 0.4 mg by mouth daily. (Patient not taking: Reported on 03/12/2024)   Not Taking   Allergies:  Allergies  Allergen Reactions   Oxycodone -Acetaminophen  Itching    Not allergic to tylenol     Family History  Problem Relation Age of Onset   Heart disease Mother    Social History:  reports that he has been smoking cigarettes. He started smoking about 50 years ago. He has a 20 pack-year smoking history. He has never used smokeless tobacco. He reports current alcohol use of about 1.0 standard drink of alcohol per week. He reports that he does not use drugs.  ROS: A complete review of systems was performed.  All systems are negative except for pertinent findings as noted. Review of Systems  All other systems reviewed and are negative.    Physical Exam:  Vital signs in last 24 hours:   General:  Alert and oriented, No acute distress HEENT: Normocephalic, atraumatic Cardiovascular: Regular rate and rhythm Lungs: Regular rate and effort Abdomen: Soft, nontender, nondistended, no abdominal masses Back: No CVA tenderness Extremities: No edema Neurologic: Grossly intact  Laboratory Data:  No results found for this or any previous visit (from the past 24 hours). No results found for this or any previous visit (from the past 240  hours). Creatinine: No results for input(s): CREATININE in the last 168 hours.  Impression/Assessment:  History of bladder cancer with bladder neoplasm of uncertain malignant potential-  Plan:  I discussed with the patient the nature, potential benefits, risks and alternatives to TURBT with postoperative instillation of gemcitabine , including side effects of the proposed treatment, the likelihood of the patient achieving the goals of the procedure, and any potential problems that might occur during the procedure or recuperation. All questions answered. Patient elects to proceed.   Donnice Brooks 03/16/2024

## 2024-03-16 NOTE — Anesthesia Procedure Notes (Signed)
 Procedure Name: Intubation Date/Time: 03/16/2024 9:51 AM  Performed by: Christopher Comings, CRNAPre-anesthesia Checklist: Patient identified, Emergency Drugs available, Suction available and Patient being monitored Patient Re-evaluated:Patient Re-evaluated prior to induction Oxygen Delivery Method: Circle system utilized Preoxygenation: Pre-oxygenation with 100% oxygen Induction Type: IV induction Ventilation: Mask ventilation without difficulty Laryngoscope Size: Mac and 4 Grade View: Grade I Tube type: Oral Tube size: 7.5 mm Number of attempts: 1 Airway Equipment and Method: Stylet and Oral airway Placement Confirmation: ETT inserted through vocal cords under direct vision, positive ETCO2 and breath sounds checked- equal and bilateral Secured at: 23 cm Tube secured with: Tape Dental Injury: Teeth and Oropharynx as per pre-operative assessment

## 2024-03-16 NOTE — Transfer of Care (Signed)
 Immediate Anesthesia Transfer of Care Note  Patient: Ronald Atkins  Procedure(s) Performed: TURBT (TRANSURETHRAL RESECTION OF BLADDER TUMOR)  Patient Location: PACU  Anesthesia Type:General  Level of Consciousness: awake, alert , and oriented  Airway & Oxygen Therapy: Patient Spontanous Breathing  Post-op Assessment: Report given to RN and Post -op Vital signs reviewed and stable  Post vital signs: Reviewed and stable  Last Vitals:  Vitals Value Taken Time  BP 111/79 03/16/24 10:35  Temp    Pulse 75 03/16/24 10:36  Resp 13 03/16/24 10:36  SpO2 94 % 03/16/24 10:36  Vitals shown include unfiled device data.  Last Pain:  Vitals:   03/16/24 0759  TempSrc: Oral  PainSc: 0-No pain      Patients Stated Pain Goal: 4 (03/16/24 0759)  Complications: There were no known notable events for this encounter.

## 2024-03-16 NOTE — Op Note (Signed)
 Preoperative diagnosis: Bladder neoplasm of uncertain malignant potential Postoperative diagnosis: Same  Procedure: 1) TURBT 0.5 to 2 cm 2) instillation of gemcitabine  in PACU  Surgeon: Nieves  Anesthesia: General  Indication for procedure Ronald Atkins is a 68 year old male with a history of recurrent superficial bladder tumors.  On office cystoscopy in August 2025 a small tumor was noted on the right lateral wall with a more nodular component.  Findings: On exam under anesthesia the penis was circumcised and without mass or lesion.  The glans and meatus appeared normal.  The scrotum appeared normal.  The testicles were descended bilaterally and palpably normal.  Slightly atrophic.  On DRE prostate was about 30 g and smooth without hard area or nodule.  On cystoscopy there was a an early papillary right bladder wall tumor.  This looked much smaller more superficial in the operating room.  There was no nodular component and no significant surrounding lesions.  Description of procedure: After consent was obtained patient brought to the operating room.  After adequate anesthesia he is placed in lithotomy position and prepped and draped in the usual sterile fashion.  Timeout was performed to confirm the patient and procedure.  The cystoscope was passed per urethra and the bladder carefully inspected with a 30 and 70 degree lens.  The cystoscope was removed and the continuous-flow sheath passed with the visual obturator and that was swapped out for the loop and handle.  The right bladder wall tumor was resected.  It was slightly difficult to reach so I did not get the deep specimen but it did appear superficial.  The area was fulgurated with excellent hemostasis.  Again no other lesions were noted.  Scope was backed out and a 16 [place] Foley catheter was placed.  Exam under anesthesia was performed.  He was awakened taken the cover room in stable condition.  Instillation of gemcitabine  in PACU: Gemcitabine   2000 mg was instilled per urethra and allowed to dwell for 55 minutes.  It was then drained and the Foley removed.  Complications: None  Blood loss: Minimal  Specimens to pathology: Right bladder wall tumor resection  Drains: 16 French Foley to be removed in PACU.  Disposition: Patient stable to PACU.  Discussed the procedure, postop care and follow-up with Mrs. Joshua.

## 2024-03-17 NOTE — Anesthesia Postprocedure Evaluation (Signed)
 Anesthesia Post Note  Patient: Ronald Atkins  Procedure(s) Performed: TURBT (TRANSURETHRAL RESECTION OF BLADDER TUMOR)     Patient location during evaluation: PACU Anesthesia Type: General Level of consciousness: awake and alert Pain management: pain level controlled Vital Signs Assessment: post-procedure vital signs reviewed and stable Respiratory status: spontaneous breathing, nonlabored ventilation, respiratory function stable and patient connected to nasal cannula oxygen Cardiovascular status: blood pressure returned to baseline and stable Postop Assessment: no apparent nausea or vomiting Anesthetic complications: no   There were no known notable events for this encounter.  Last Vitals:  Vitals:   03/16/24 1130 03/16/24 1235  BP: 110/79 121/76  Pulse: 60 65  Resp: 19 16  Temp: 36.6 C 36.7 C  SpO2: 95% 95%    Last Pain:  Vitals:   03/16/24 1235  TempSrc:   PainSc: 0-No pain                 Cordella P Eufelia Veno

## 2024-03-18 ENCOUNTER — Encounter (HOSPITAL_COMMUNITY): Payer: Self-pay | Admitting: Urology

## 2024-03-20 LAB — SURGICAL PATHOLOGY

## 2024-05-17 ENCOUNTER — Ambulatory Visit

## 2024-05-17 VITALS — BP 130/82 | HR 74 | Temp 98.2°F | Ht 69.0 in | Wt 215.0 lb

## 2024-05-17 DIAGNOSIS — Z9109 Other allergy status, other than to drugs and biological substances: Secondary | ICD-10-CM | POA: Diagnosis not present

## 2024-05-17 DIAGNOSIS — Z720 Tobacco use: Secondary | ICD-10-CM

## 2024-05-17 DIAGNOSIS — I2510 Atherosclerotic heart disease of native coronary artery without angina pectoris: Secondary | ICD-10-CM | POA: Diagnosis not present

## 2024-05-17 DIAGNOSIS — J439 Emphysema, unspecified: Secondary | ICD-10-CM | POA: Diagnosis not present

## 2024-05-17 DIAGNOSIS — J849 Interstitial pulmonary disease, unspecified: Secondary | ICD-10-CM | POA: Diagnosis not present

## 2024-05-17 DIAGNOSIS — F17210 Nicotine dependence, cigarettes, uncomplicated: Secondary | ICD-10-CM | POA: Diagnosis not present

## 2024-05-17 NOTE — Patient Instructions (Addendum)
 It was a pleasure to see you today. Please have your labs drawn at your convenience.  Your pulmonary function test will be scheduled at check out. Request your doctor to send a referral to a cardiologist

## 2024-05-17 NOTE — Progress Notes (Signed)
 New Patient Pulmonology Office Visit   Subjective:  Patient ID: Ronald Atkins, male    DOB: 22-May-1956  MRN: [id number]  Referred by: Waylan Almarie SAUNDERS, MD  CC:  Chief Complaint  Patient presents with   Consult   Shortness of Breath    Climbing stairs    COPD    HPI Ronald Atkins is a 68 y.o. male who is referred to this clinic for shortness of breath   Discussed the use of AI scribe software for clinical note transcription with the patient, who gave verbal consent to proceed.  History of Present Illness Ronald Atkins is a 67 year old male with COPD who presents with shortness of breath and back pain. He was referred by his primary care doctor at [facility] for evaluation of his lung condition.  He experiences shortness of breath, particularly during exertion, such as climbing stairs. He became 'real short of breath' after climbing halfway up 120 concrete steps recently. He wasn't sure if his back pain made it worse. No wheezing or regular coughing. He is on medication to quit smoking and has reduced his smoking from a pack a day to five or six cigarettes a day. He has a history of emphysema and has been using Trelegy for over a year, which has improved his breathing. He also uses Air Supra as an emergency inhaler, though he rarely needs it. 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 He is currently on workman's compensation and undergoing physical therapy, receiving injections, and has shown improvement, having progressed from using crutches to walking. However, he still experiences limitations in walking and occasional 'electric shock' sensations down the backside of his leg, which he describes as numb from the front down. He has had three injections for his back pain and is awaiting further evaluation for his lifting limitations.    He has undergone CT scans for lung cancer screening, including a recent scan that was reviewed during this visit. Prior CT chest 2040from [facility] shows emphysema and some increased subpleural reticulations He reports occasional swelling in his legs, which was previously severe enough to obscure his ankles, but is not present at the time of the visit.  He has a history of sinus symptoms, particularly during pollen season, which have worsened with age. No heart evaluation by a cardiologist has been done.       ROS Review of symptoms negative except mentioned above   Allergies: Oxycodone -acetaminophen   Current Outpatient Medications:    AIRSUPRA 90-80 MCG/ACT AERO, Inhale 2 puffs into the lungs every 4 (four) hours as needed., Disp: , Rfl:    Azelastine HCl 137 MCG/SPRAY SOLN, Place 2 sprays into both nostrils 2 (two) times daily., Disp: , Rfl:    ibuprofen (ADVIL) 400 MG tablet, Take 400 mg by mouth every 6 (six) hours as needed., Disp: , Rfl:    Multiple Vitamin (MULTIVITAMIN) tablet, Take 1 tablet by mouth daily., Disp: , Rfl:    olmesartan-hydrochlorothiazide (BENICAR HCT) 40-12.5 MG tablet, Take 0.5 tablets by mouth daily., Disp: , Rfl:    rosuvastatin (CRESTOR) 5 MG tablet, Take 5 mg by mouth daily., Disp: , Rfl:    TRELEGY ELLIPTA 200-62.5-25 MCG/ACT AEPB, Take 1 puff by mouth daily., Disp: , Rfl:    gabapentin  (NEURONTIN ) 300 MG capsule, Take 1 capsule (300 mg total)  by mouth 3 (three) times daily for 20 days. (Patient not taking: Reported on 03/16/2024), Disp: 60 capsule, Rfl: 0   HYDROcodone -acetaminophen  (NORCO/VICODIN) 5-325 MG tablet, Take 1-2 tablets by mouth every 6 (six) hours as needed. (Patient not taking: Reported on 03/12/2024), Disp: 10 tablet, Rfl: 0   methocarbamol  (ROBAXIN ) 500 MG tablet, Take 1 tablet (500 mg total) by mouth every 8 (eight) hours as needed., Disp: 20 tablet, Rfl: 0   predniSONE  (STERAPRED UNI-PAK 21 TAB) 10 MG (21) TBPK tablet, Take by mouth daily. Take  6 tabs by mouth daily  for 2 days, then 5 tabs for 2 days, then 4 tabs for 2 days, then 3 tabs for 2 days, 2 tabs for 2 days, then 1 tab by mouth daily for 2 days (Patient not taking: Reported on 03/16/2024), Disp: 42 tablet, Rfl: 0   primidone  (MYSOLINE ) 50 MG tablet, TAKE 0.5-1 TABLETS (25-50 MG TOTAL) BY MOUTH 2 (TWO) TIMES DAILY AS NEEDED., Disp: 180 tablet, Rfl: 1   tamsulosin (FLOMAX) 0.4 MG CAPS capsule, Take 0.4 mg by mouth daily. (Patient not taking: Reported on 05/17/2024), Disp: , Rfl:  No current facility-administered medications for this visit.  Facility-Administered Medications Ordered in Other Visits:    gemcitabine  (GEMZAR ) chemo syringe for bladder instillation 2,000 mg, 2,000 mg, Bladder Instillation, Once, Nieves Cough, MD Past Medical History:  Diagnosis Date   Anxiety    Arthritis    hands   Bladder cancer [facility])    urologist-- dr eskridge   Bladder neoplasm    COPD (chronic obstructive pulmonary disease) (HCC)    DDD (degenerative disc disease), cervical    cervical disc ruptured and compression V4-V7   Diverticulosis of colon    History of acute prostatitis    10-16-2014--  RESOLVED   History of head injury 1981   Military accident   History of left shoulder fracture 1981   Military accident   History of migraine    from neck pain   History of rib fracture 1981   Military accident   Hypertension    Lump in neck    Left side   Lung nodule 11/27/2014   3mm subpleural nodule left lung base   Pneumonia    remote history of walking pneumonia   Tinnitus    Past Surgical History:  Procedure Laterality Date   COLONOSCOPY     CYSTOSCOPY WITH BIOPSY N/A 08/20/2016   Procedure: CYSTOSCOPY WITH BIOPSY AND FULGURATION INSTILL EPIRUBICIN ;  Surgeon: Cough Nieves, MD;  Location: [facility];  Service: Urology;  Laterality: N/A;   CYSTOSCOPY WITH BIOPSY N/A 04/24/2019   Procedure: CYSTOSCOPY WITH BLADDER BIOPSY 0.5-2CM, FULGURATION/ EPIRUBICIN   INSTILLATION/ BILATERAL RETROGRADE PYELOGRAM;  Surgeon: Nieves Cough, MD;  [facility];  Service: Urology;  Laterality: N/A;   CYSTOSCOPY WITH FULGERATION N/A 11/03/2018   Procedure: CYSTOSCOPY WITH FULGERATION/ BLADDER BIOPSY/INSTILLATION OF GEMCITABINE ;  Surgeon: Nieves Cough, MD;  [facility];  Service: Urology;  Laterality: N/A;   FRACTURE SURGERY  age 64 (approx)   military service injury   HAND SURGERY  [phone]   Left small finger after crushing accident   TRANSURETHRAL RESECTION OF BLADDER TUMOR N/A 02/11/2015   Procedure: TRANSURETHRAL RESECTION OF BLADDER TUMOR (TURBT) ;  Surgeon: Cough Nieves, MD;  [facility]: [facility];  Service: Urology;  Laterality: N/A;   TRANSURETHRAL RESECTION OF BLADDER TUMOR N/A 03/10/2018   Procedure: TRANSURETHRAL RESECTION OF BLADDER TUMOR (TURBT)/ CYSTOSCOPY/WITH BILATERAL RETROGRADES, POST OPERATIVE INSTILLATION OF CHEMOTHERAPY IN PACU;  Surgeon: Nieves Cough, MD;  Location: [facility];  Service: Urology;  Laterality: N/A;   TRANSURETHRAL RESECTION OF BLADDER TUMOR N/A 03/16/2024   Procedure: TURBT (TRANSURETHRAL RESECTION OF BLADDER TUMOR);  Surgeon: Nieves Cough, MD;  Location: [facility];  Service: Urology;  Laterality: N/A;   TRANSURETHRAL RESECTION OF BLADDER TUMOR WITH MITOMYCIN -C N/A 11/19/2022   Procedure: TRANSURETHRAL RESECTION OF BLADDER TUMOR;  Surgeon: Nieves Cough, MD;  [facility];  Service: Urology;  Laterality: N/A;  32 MINS FOR CASE   Family History  Problem Relation Age of Onset   Heart disease Mother    Social History   Socioeconomic History   Marital status: Married    Spouse name: Not on file   Number of children: 2   Years of education: Not on file   Highest education level: Not on file  Occupational History   Not on file  Tobacco Use   Smoking status: Every Day    Current packs/day: 0.00    Average packs/day: 0.5 packs/day for 40.0 years (20.0 ttl pk-yrs)     Types: Cigarettes    Start date: 02/03/1974    Last attempt to quit: 02/03/2014    Years since quitting: 10.2   Smokeless tobacco: Never   Tobacco comments:    Started smoking at age 15. Smoke 5-6 cigarettes a day. 05/17/2024.  Vaping Use   Vaping status: Former   Start date: 10/26/2013   Quit date: 03/28/2014   Devices: used to quit smoking cigaretts  Substance and Sexual Activity   Alcohol use: Yes    Alcohol/week: 1.0 standard drink of alcohol    Types: 1 Standard drinks or equivalent per week    Comment: occ beer   Drug use: No   Sexual activity: Not Currently  Other Topics Concern   Not on file  Social History Narrative   Caffiene expresso (12 oz daily)   drink like redbull (NOS)   Work:  Web Designer   Social Drivers of Corporate Investment Banker Strain: Not on file  Food Insecurity: Not on file  Transportation Needs: Not on file  Physical Activity: Not on file  Stress: Not on file  Social Connections: Not on file  Intimate Partner Violence: Not on file         Objective:  BP 130/82   Pulse 74   Temp 98.2 F (36.8 C) (Oral)   Ht 5' 9 (1.753 m)   Wt 215 lb (97.5 kg)   SpO2 94%   BMI 31.75 kg/m    Physical Exam Constitutional:      General: He is not in acute distress.    Appearance: Normal appearance.  HENT:     Mouth/Throat:     Mouth: Mucous membranes are moist.  Cardiovascular:     Rate and Rhythm: Normal rate.  Pulmonary:     Effort: No respiratory distress.     Breath sounds: No wheezing or rales.  Musculoskeletal:     Right lower leg: No edema.     Left lower leg: No edema.  Skin:    General: Skin is warm.  Neurological:     Mental Status: He is alert and oriented to person, place, and time.  Psychiatric:        Mood and Affect: Mood normal.     Diagnostic Review:    Pft     No data to display  Results  CT chest 02/2024 Mild centrilobular and paraseptal emphysema. Mild diffuse bronchial wall  thickening. Similar upper lung and anterior predominant subpleural reticulations and groundglass opacities. Stable solid pulmonary nodules measuring up to 5 mm (7:166). Atelectasis in the lingula and right middle lobe.    Chest CT (2020): Emphysema, subpleural reticulation, ground glass opacities      Assessment & Plan:   Assessment & Plan Chronic obstructive pulmonary disease with emphysema, unspecified emphysema type (HCC) Discussed the symptoms, etiology, pathophysiology, diagnostic test, treatment, flare ups,  prognosis of copd Pt compliant on trelegy. Encouraged compliance with as needed rescue inhaler. Reports baseline well controlled symptoms I advised pt to rinse mouth after inhaler use  Orders:   CBC w/Diff; Future   IgE; Future   Pulmonary function test; Future  Tobacco abuse Smoking/Tobacco Cessation Counseling HAWKINS SEAMAN is a current user of tobacco or nicotine products. He is ready to quit at this time. Counseling provided today addressed the risks of continued use and the benefits of cessation. Discussed tobacco/nicotine use history, readiness to quit, and evidence-based treatment options including behavioral strategies, support resources, and pharmacologic therapies. Provided encouragement and educational materials on steps and resources to quit smoking. Patient questions were addressed, and follow-up recommended for continued support. Total time spent on counseling: 4 minutes.    Orders:   CBC w/Diff; Future   IgE; Future   Pulmonary function test; Future  ILD (interstitial lung disease) (HCC) Visible on ct chest 2020 Reports from [facility] mention ground glass opacities and subpleural reticulations on most recent CT chest. Will request imaging Briefly discussed the pathophysiology of ILD Orders:   RESPIRATORY ALLERGY PANEL REGION II W/ RFLX: [place]   Pulmonary function test; Future  Environmental allergies  Orders:   CBC w/Diff; Future    IgE; Future   RESPIRATORY ALLERGY PANEL REGION II W/ RFLX: [place]   Pulmonary function test; Future  Coronary artery calcification Recommend cardiology evaluation. Patient will discuss this with his PCP who he is seeing later today      Thank you for the opportunity to take part in the care of GRAYSON PFEFFERLE   Return in about 6 weeks (around 06/28/2024).   Daffney Greenly Pleas, MD [facility] Office: [phone]

## 2024-07-20 ENCOUNTER — Ambulatory Visit

## 2024-07-20 VITALS — BP 132/83 | HR 74 | Ht 68.6 in | Wt 214.0 lb

## 2024-07-20 DIAGNOSIS — J439 Emphysema, unspecified: Secondary | ICD-10-CM

## 2024-07-20 DIAGNOSIS — J849 Interstitial pulmonary disease, unspecified: Secondary | ICD-10-CM | POA: Diagnosis not present

## 2024-07-20 DIAGNOSIS — F17210 Nicotine dependence, cigarettes, uncomplicated: Secondary | ICD-10-CM | POA: Diagnosis not present

## 2024-07-20 DIAGNOSIS — Z720 Tobacco use: Secondary | ICD-10-CM

## 2024-07-20 DIAGNOSIS — Z9109 Other allergy status, other than to drugs and biological substances: Secondary | ICD-10-CM

## 2024-07-20 DIAGNOSIS — F17200 Nicotine dependence, unspecified, uncomplicated: Secondary | ICD-10-CM

## 2024-07-20 LAB — PULMONARY FUNCTION TEST
DL/VA % pred: 73 %
DL/VA: 3.04 ml/min/mmHg/L
DLCO unc % pred: 70 %
DLCO unc: 18.02 ml/min/mmHg
FEF 25-75 Post: 1.4 L/s
FEF 25-75 Pre: 0.89 L/s
FEF2575-%Change-Post: 57 %
FEF2575-%Pred-Post: 55 %
FEF2575-%Pred-Pre: 35 %
FEV1-%Change-Post: 13 %
FEV1-%Pred-Post: 65 %
FEV1-%Pred-Pre: 57 %
FEV1-Post: 2.1 L
FEV1-Pre: 1.86 L
FEV1FVC-%Change-Post: 0 %
FEV1FVC-%Pred-Pre: 85 %
FEV6-%Change-Post: 12 %
FEV6-%Pred-Post: 80 %
FEV6-%Pred-Pre: 71 %
FEV6-Post: 3.29 L
FEV6-Pre: 2.92 L
FEV6FVC-%Change-Post: -1 %
FEV6FVC-%Pred-Post: 104 %
FEV6FVC-%Pred-Pre: 105 %
FVC-%Change-Post: 13 %
FVC-%Pred-Post: 76 %
FVC-%Pred-Pre: 67 %
FVC-Post: 3.34 L
FVC-Pre: 2.93 L
Post FEV1/FVC ratio: 63 %
Post FEV6/FVC ratio: 99 %
Pre FEV1/FVC ratio: 63 %
Pre FEV6/FVC Ratio: 100 %
RV % pred: 158 %
RV: 3.69 L
TLC % pred: 104 %
TLC: 7.06 L

## 2024-07-20 MED ORDER — ALBUTEROL SULFATE HFA 108 (90 BASE) MCG/ACT IN AERS: 2.0000 | INHALATION_SPRAY | Freq: Four times a day (QID) | RESPIRATORY_TRACT | 6 refills | Status: AC | PRN

## 2024-07-20 MED ORDER — TRELEGY ELLIPTA 200-62.5-25 MCG/ACT IN AEPB: 1.0000 | INHALATION_SPRAY | Freq: Every day | RESPIRATORY_TRACT | 7 refills | Status: AC

## 2024-07-20 NOTE — Patient Instructions (Signed)
 Full pft performed today

## 2024-07-20 NOTE — Patient Instructions (Addendum)
 It was a pleasure to see you today. You will be due for high resolution CT chest in Sept 2026- this will also serve as yearly lung cancer screening  Continue trelegy daily- Please rinse your mouth after inhaler use.  Use albuterol  as needed

## 2024-07-20 NOTE — Progress Notes (Signed)
 Full pft performed today

## 2024-07-20 NOTE — Progress Notes (Signed)
 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 He has a history of sinus symptoms, particularly during pollen season   Ronald Atkins is a 69 y.o. male who is here for follow up.  Discussed the use of AI scribe software for clinical note transcription with the patient, who gave verbal consent to proceed.  History of Present Illness Using trelegy 200 daily Doesn't need rescue inhaler  Still smokes 3-5 cigarette  Denies cough, shortness of breath  No copd related hospital admissions/ed visits       ROS Review of symptoms negative except mentioned above   Allergies: Oxycodone -acetaminophen  Current Medications[1] Past Medical History:  Diagnosis Date   Anxiety    Arthritis    hands   Bladder cancer [facility])    urologist-- dr eskridge   Bladder neoplasm    COPD (chronic obstructive pulmonary disease) (HCC)    DDD (degenerative disc disease), cervical    cervical disc ruptured and compression V4-V7   Diverticulosis of colon    History of acute prostatitis    10-16-2014--  RESOLVED   History of head injury 1981   Military accident   History of left shoulder fracture 1981   Military accident   History of migraine    from neck pain   History of rib fracture 1981   Military accident   Hypertension    Lump in neck    Left side   Lung nodule 11/27/2014   3mm subpleural nodule left lung base   Pneumonia    remote history of walking pneumonia   Tinnitus    Past Surgical History:  Procedure Laterality Date    COLONOSCOPY     CYSTOSCOPY WITH BIOPSY N/A 08/20/2016   Procedure: CYSTOSCOPY WITH BIOPSY AND FULGURATION INSTILL EPIRUBICIN ;  Surgeon: Donnice Brooks, MD;  Location: [facility];  Service: Urology;  Laterality: N/A;   CYSTOSCOPY WITH BIOPSY N/A 04/24/2019   Procedure: CYSTOSCOPY WITH BLADDER BIOPSY 0.5-2CM, FULGURATION/ EPIRUBICIN  INSTILLATION/ BILATERAL RETROGRADE PYELOGRAM;  Surgeon: Brooks Donnice, MD;  Location: [facility];  Service: Urology;  Laterality: N/A;   CYSTOSCOPY WITH FULGERATION N/A 11/03/2018   Procedure: CYSTOSCOPY WITH FULGERATION/ BLADDER BIOPSY/INSTILLATION OF GEMCITABINE ;  Surgeon: Brooks Donnice, MD;  Location: [facility];  Service: Urology;  Laterality: N/A;   FRACTURE SURGERY  age 64 (approx)   military service injury   HAND SURGERY  [phone]   Left small finger after crushing accident   TRANSURETHRAL RESECTION OF BLADDER TUMOR N/A 02/11/2015   Procedure: TRANSURETHRAL RESECTION OF BLADDER TUMOR (TURBT) ;  Surgeon: Donnice Brooks, MD;  Location: [facility];  Service: Urology;  Laterality: N/A;   TRANSURETHRAL RESECTION OF BLADDER TUMOR N/A 03/10/2018   Procedure: TRANSURETHRAL RESECTION OF BLADDER TUMOR (TURBT)/ CYSTOSCOPY/WITH BILATERAL RETROGRADES, POST OPERATIVE INSTILLATION OF CHEMOTHERAPY IN PACU;  Surgeon: Brooks Donnice, MD;  Location: [facility];  Service: Urology;  Laterality: N/A;   TRANSURETHRAL RESECTION OF BLADDER TUMOR N/A 03/16/2024  Procedure: TURBT (TRANSURETHRAL RESECTION OF BLADDER TUMOR);  Surgeon: Nieves Cough, MD;  Location: [facility];  Service: Urology;  Laterality: N/A;   TRANSURETHRAL RESECTION OF BLADDER TUMOR WITH MITOMYCIN -C N/A 11/19/2022   Procedure: TRANSURETHRAL RESECTION OF BLADDER TUMOR;  Surgeon: Nieves Cough, MD;  Location: [facility];  Service: Urology;  Laterality: N/A;  53 MINS FOR CASE   Family History  Problem Relation Age of Onset   Heart disease Mother    Social History    Socioeconomic History   Marital status: Married    Spouse name: Not on file   Number of children: 2   Years of education: Not on file   Highest education level: Not on file  Occupational History   Not on file  Tobacco Use   Smoking status: Every Day    Current packs/day: 0.00    Average packs/day: 0.5 packs/day for 40.0 years (20.0 ttl pk-yrs)    Types: Cigarettes    Start date: 02/03/1974    Last attempt to quit: 02/03/2014    Years since quitting: 10.4   Smokeless tobacco: Never   Tobacco comments:    Started smoking at age 19. Smoke 5-6 cigarettes a day. 05/17/2024.  Vaping Use   Vaping status: Former   Start date: 10/26/2013   Quit date: 03/28/2014   Devices: used to quit smoking cigaretts  Substance and Sexual Activity   Alcohol use: Yes    Alcohol/week: 1.0 standard drink of alcohol    Types: 1 Standard drinks or equivalent per week    Comment: occ beer   Drug use: No   Sexual activity: Not Currently  Other Topics Concern   Not on file  Social History Narrative   Caffiene expresso (12 oz daily)   drink like redbull (NOS)   Work:  Web Designer   Social Drivers of Health   Tobacco Use: High Risk (07/20/2024)   Patient History    Smoking Tobacco Use: Every Day    Smokeless Tobacco Use: Never    Passive Exposure: Not on file  Financial Resource Strain: Not on file  Food Insecurity: Not on file  Transportation Needs: Not on file  Physical Activity: Not on file  Stress: Not on file  Social Connections: Not on file  Intimate Partner Violence: Not on file  Depression (EYV7-0): Not on file  Alcohol Screen: Not on file  Housing: Not on file  Utilities: Not on file  Health Literacy: Not on file         Objective:  BP 132/83   Pulse 74   Ht 5' 8.6 (1.742 m) Comment: per RRT notes  Wt 214 lb (97.1 kg) Comment: per RRT notes  SpO2 94%   BMI 31.97 kg/m    Physical Exam Constitutional:      General: He is not in acute distress.    Appearance:  Normal appearance.  HENT:     Mouth/Throat:     Mouth: Mucous membranes are moist.  Cardiovascular:     Rate and Rhythm: Normal rate.  Pulmonary:     Effort: No respiratory distress.     Breath sounds: No wheezing or rales.  Musculoskeletal:     Right lower leg: No edema.     Left lower leg: No edema.  Skin:    General: Skin is warm.  Neurological:     Mental Status: He is alert and oriented to person, place, and time.  Psychiatric:        Mood and Affect: Mood  normal.     Diagnostic Review:    Pft    Latest Ref Rng & Units 07/20/2024    9:49 AM  PFT Results  FVC-Pre L 2.93  P  FVC-Predicted Pre % 67  P  FVC-Post L 3.34  P  FVC-Predicted Post % 76  P  Pre FEV1/FVC % % 63  P  Post FEV1/FCV % % 63  P  FEV1-Pre L 1.86  P  FEV1-Predicted Pre % 57  P  FEV1-Post L 2.10  P  DLCO uncorrected ml/min/mmHg 18.02  P  DLCO UNC% % 70  P  DLVA Predicted % 73  P  TLC L 7.06  P  TLC % Predicted % 104  P  RV % Predicted % 158  P    P Preliminary result  FEV1 and FVC Improved 13% post BD RV 158% DLCO 70%     CT chest 02/2024 Mild centrilobular and paraseptal emphysema. Mild diffuse bronchial wall thickening.  Similar upper lung and anterior predominant subpleural reticulations and groundglass opacities. Stable solid pulmonary nodules measuring up to 5 mm (7:166). Atelectasis in the lingula and right middle lobe.      Chest CT (2020): Emphysema, subpleural reticulation, ground glass opacities  Results       Assessment & Plan:   Assessment & Plan Chronic obstructive pulmonary disease with emphysema, unspecified emphysema type (HCC) Symptoms well controlled at this time Reviewed PFT with pt Continue trelegy 200 I advised pt to rinse mouth after inhaler use Use albuterol  for rescue instead of airsupra Orders:   albuterol  (VENTOLIN  HFA) 108 (90 Base) MCG/ACT inhaler; Inhale 2 puffs into the lungs every 6 (six) hours as needed for wheezing or shortness of breath.   CT  CHEST HIGH RESOLUTION; Future   TRELEGY ELLIPTA  200-62.5-25 MCG/ACT AEPB; Take 1 puff by mouth daily.  ILD (interstitial lung disease) (HCC) Minimal on last CT chest TLC 104% 06/2024 No known rheumatological disease Will schedule HRCT at the time of next lung cancer screening, printed order sent with the pt Orders:   CT CHEST HIGH RESOLUTION; Future  Smoker Encouraged pt to abstain from smoking altogether Smoking 3-5 cig a day now Smoking/Tobacco Cessation Counseling Ronald Atkins is a current user of tobacco or nicotine products. He is considering quitting at this time. Counseling provided today addressed the risks of continued use and the benefits of cessation. Discussed tobacco/nicotine use history, readiness to quit, and evidence-based treatment options including behavioral strategies, support resources, and pharmacologic therapies. Provided encouragement and educational materials on steps and resources to quit smoking. Patient questions were addressed, and follow-up recommended for continued support. Total time spent on counseling: 4 minutes.   Orders:   CT CHEST HIGH RESOLUTION; Future    Thank you for the opportunity to take part in the care of Ronald Atkins   Return in about 8 months (around 03/13/2025).   Saya Mccoll Pleas, MD [facility] Office: [phone]     [1]  Current Outpatient Medications:    albuterol  (VENTOLIN  HFA) 108 (90 Base) MCG/ACT inhaler, Inhale 2 puffs into the lungs every 6 (six) hours as needed for wheezing or shortness of breath., Disp: 8 g, Rfl: 6   Azelastine HCl 137 MCG/SPRAY SOLN, Place 2 sprays into both nostrils 2 (two) times daily., Disp: , Rfl:    ibuprofen (ADVIL) 400 MG tablet, Take 400 mg by mouth every 6 (six) hours as needed., Disp: , Rfl:    Multiple Vitamin (MULTIVITAMIN) tablet, Take  1 tablet by mouth daily., Disp: , Rfl:    olmesartan-hydrochlorothiazide (BENICAR HCT) 40-12.5 MG tablet, Take 0.5 tablets by mouth  daily., Disp: , Rfl:    rosuvastatin (CRESTOR) 5 MG tablet, Take 5 mg by mouth daily., Disp: , Rfl:    tamsulosin (FLOMAX) 0.4 MG CAPS capsule, Take 0.4 mg by mouth daily., Disp: , Rfl:    gabapentin  (NEURONTIN ) 300 MG capsule, Take 1 capsule (300 mg total) by mouth 3 (three) times daily for 20 days. (Patient not taking: Reported on 03/16/2024), Disp: 60 capsule, Rfl: 0   HYDROcodone -acetaminophen  (NORCO/VICODIN) 5-325 MG tablet, Take 1-2 tablets by mouth every 6 (six) hours as needed. (Patient not taking: Reported on 03/12/2024), Disp: 10 tablet, Rfl: 0   methocarbamol  (ROBAXIN ) 500 MG tablet, Take 1 tablet (500 mg total) by mouth every 8 (eight) hours as needed., Disp: 20 tablet, Rfl: 0   predniSONE  (STERAPRED UNI-PAK 21 TAB) 10 MG (21) TBPK tablet, Take by mouth daily. Take 6 tabs by mouth daily  for 2 days, then 5 tabs for 2 days, then 4 tabs for 2 days, then 3 tabs for 2 days, 2 tabs for 2 days, then 1 tab by mouth daily for 2 days (Patient not taking: Reported on 03/16/2024), Disp: 42 tablet, Rfl: 0   primidone  (MYSOLINE ) 50 MG tablet, TAKE 0.5-1 TABLETS (25-50 MG TOTAL) BY MOUTH 2 (TWO) TIMES DAILY AS NEEDED., Disp: 180 tablet, Rfl: 1   TRELEGY ELLIPTA  200-62.5-25 MCG/ACT AEPB, Take 1 puff by mouth daily., Disp: 1 each, Rfl: 7 No current facility-administered medications for this visit.  Facility-Administered Medications Ordered in Other Visits:    gemcitabine  (GEMZAR ) chemo syringe for bladder instillation 2,000 mg, 2,000 mg, Bladder Instillation, Once, Nieves Cough, MD  "
